# Patient Record
Sex: Female | Born: 1990 | Race: White | Hispanic: No | Marital: Married | Smoking: Never smoker
Health system: Southern US, Community
[De-identification: ages and names within clinical notes are randomized; demographics above are authoritative.]

## PROBLEM LIST (undated history)

## (undated) DIAGNOSIS — K635 Polyp of colon: Secondary | ICD-10-CM

## (undated) HISTORY — DX: Polyp of colon: K63.5

## (undated) HISTORY — PX: FOOT SURGERY: SHX648

---

## 2016-03-04 HISTORY — PX: COLONOSCOPY: SHX5424

## 2018-12-13 ENCOUNTER — Emergency Department (HOSPITAL_COMMUNITY): Payer: BC Managed Care – PPO

## 2018-12-13 ENCOUNTER — Other Ambulatory Visit: Payer: Self-pay

## 2018-12-13 ENCOUNTER — Encounter (HOSPITAL_COMMUNITY): Payer: Self-pay | Admitting: *Deleted

## 2018-12-13 ENCOUNTER — Emergency Department (HOSPITAL_COMMUNITY)
Admission: EM | Admit: 2018-12-13 | Discharge: 2018-12-13 | Disposition: A | Discharge status: Home / Self Care | Payer: BC Managed Care – PPO | Attending: Emergency Medicine | Admitting: Emergency Medicine

## 2018-12-13 DIAGNOSIS — B349 Viral infection, unspecified: Secondary | ICD-10-CM | POA: Diagnosis not present

## 2018-12-13 DIAGNOSIS — R0602 Shortness of breath: Secondary | ICD-10-CM | POA: Diagnosis present

## 2018-12-13 LAB — COMPREHENSIVE METABOLIC PANEL
ALT: 30 U/L (ref 0–44)
AST: 28 U/L (ref 15–41)
Albumin: 4.1 g/dL (ref 3.5–5.0)
Alkaline Phosphatase: 53 U/L (ref 38–126)
Anion gap: 10 (ref 5–15)
BUN: 11 mg/dL (ref 6–20)
CO2: 22 mmol/L (ref 22–32)
Calcium: 9.1 mg/dL (ref 8.9–10.3)
Chloride: 107 mmol/L (ref 98–111)
Creatinine, Ser: 0.73 mg/dL (ref 0.44–1.00)
GFR calc Af Amer: >60 mL/min (ref >60)
GFR calc non Af Amer: >60 mL/min (ref >60)
Glucose, Bld: 114 mg/dL — ABNORMAL HIGH (ref 70–99)
Potassium: 3.1 mmol/L — ABNORMAL LOW (ref 3.5–5.1)
Sodium: 139 mmol/L (ref 135–145)
Total Bilirubin: 0.5 mg/dL (ref 0.3–1.2)
Total Protein: 6.6 g/dL (ref 6.5–8.1)

## 2018-12-13 LAB — URINALYSIS, ROUTINE W REFLEX MICROSCOPIC
Bacteria, UA: NONE SEEN
Bilirubin Urine: NEGATIVE
Glucose, UA: NEGATIVE mg/dL
Ketones, ur: NEGATIVE mg/dL
Leukocytes,Ua: NEGATIVE
Nitrite: NEGATIVE
Protein, ur: 30 mg/dL — AB
Specific Gravity, Urine: 1.031 — ABNORMAL HIGH (ref 1.005–1.030)
pH: 5 (ref 5.0–8.0)

## 2018-12-13 LAB — CBC WITH DIFFERENTIAL/PLATELET
Abs Immature Granulocytes: 0.03 10*3/uL (ref 0.00–0.07)
Basophils Absolute: 0 10*3/uL (ref 0.0–0.1)
Basophils Relative: 0 %
Eosinophils Absolute: 0 10*3/uL (ref 0.0–0.5)
Eosinophils Relative: 0 %
HCT: 39.7 % (ref 36.0–46.0)
Hemoglobin: 13.6 g/dL (ref 12.0–15.0)
Immature Granulocytes: 1 %
Lymphocytes Relative: 21 %
Lymphs Abs: 0.7 10*3/uL (ref 0.7–4.0)
MCH: 29.4 pg (ref 26.0–34.0)
MCHC: 34.3 g/dL (ref 30.0–36.0)
MCV: 85.9 fL (ref 80.0–100.0)
Monocytes Absolute: 0.7 10*3/uL (ref 0.1–1.0)
Monocytes Relative: 20 %
Neutro Abs: 1.9 10*3/uL (ref 1.7–7.7)
Neutrophils Relative %: 58 %
Platelets: 172 10*3/uL (ref 150–400)
RBC: 4.62 MIL/uL (ref 3.87–5.11)
RDW: 12.1 % (ref 11.5–15.5)
WBC: 3.3 10*3/uL — ABNORMAL LOW (ref 4.0–10.5)
nRBC: 0 % (ref 0.0–0.2)

## 2018-12-13 LAB — LACTIC ACID, PLASMA: Lactic Acid, Venous: 0.8 mmol/L (ref 0.5–1.9)

## 2018-12-13 LAB — I-STAT BETA HCG BLOOD, ED (MC, WL, AP ONLY): I-stat hCG, quantitative: <5 m[IU]/mL (ref <5)

## 2018-12-13 IMAGING — DX DG CHEST 1V PORT
1 series · 1 of 1 positions shown · non-contrast
Comparison: None.

CLINICAL DATA: Pt reporting onset of fever, cough, headache, sore
throat and worsening SOB since [REDACTED]. Pt had recent travel to [place].
Pt was tested this morning at an [HOSPITAL], has not yet received
results. SOB appears worse with exertion.

EXAM:
PORTABLE CHEST 1 VIEW

[chest]
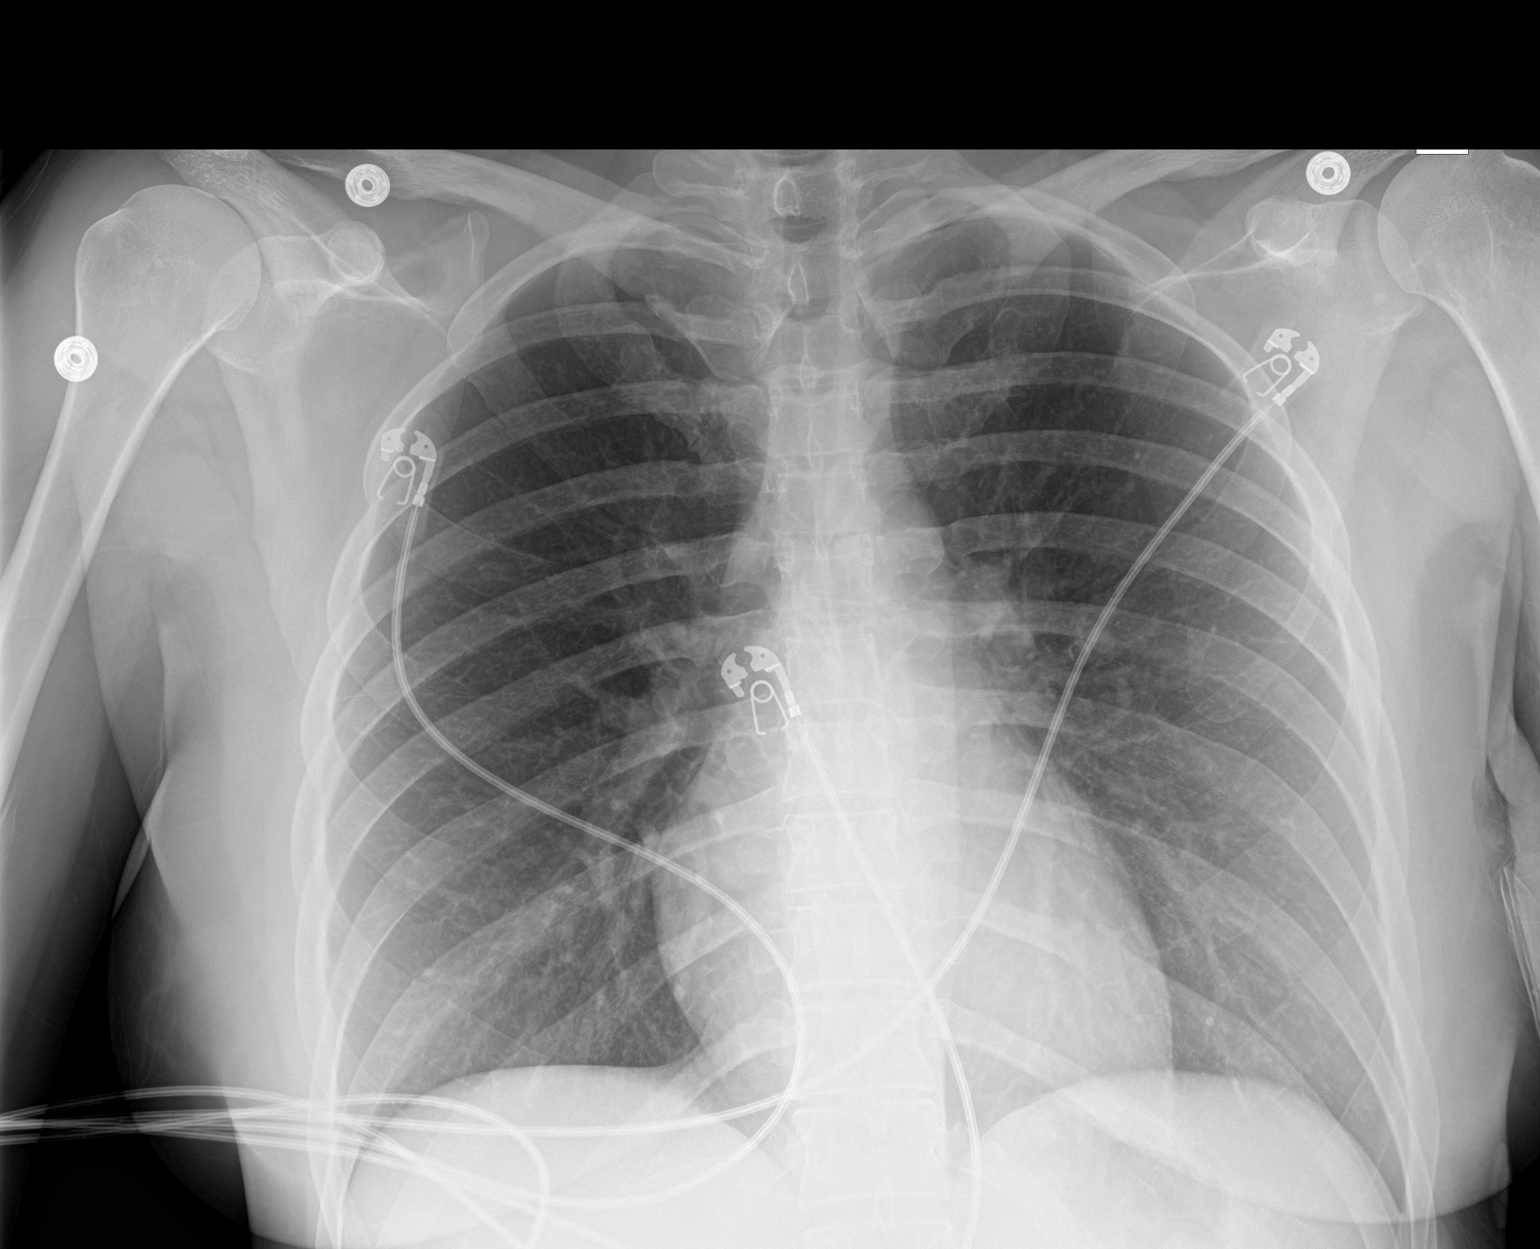

[1 of 1 positions shown; findings below may reference images not displayed]

FINDINGS: The heart size and mediastinal contours are within normal limits.
The lungs are clear. No pneumothorax or large pleural effusion. The
visualized skeletal structures are unremarkable.
IMPRESSION: No active disease.

## 2018-12-13 MED ORDER — ACETAMINOPHEN 325 MG PO TABS
650.0000 mg | ORAL_TABLET | Freq: Once | ORAL | Status: AC
  Administered 2018-12-13: 650 mg via ORAL
  Filled 2018-12-13: qty 2

## 2018-12-13 MED ORDER — SODIUM CHLORIDE 0.9% FLUSH
3.0000 mL | Freq: Once | INTRAVENOUS | Status: AC
  Administered 2018-12-13: 3 mL via INTRAVENOUS

## 2018-12-13 MED ORDER — POTASSIUM CHLORIDE CRYS ER 20 MEQ PO TBCR: 40.0000 meq | EXTENDED_RELEASE_TABLET | Freq: Once | ORAL | Status: DC

## 2018-12-13 MED ORDER — SODIUM CHLORIDE 0.9 % IV BOLUS
1000.0000 mL | Freq: Once | INTRAVENOUS | Status: AC
  Administered 2018-12-13: 1000 mL via INTRAVENOUS

## 2018-12-13 MED ORDER — POTASSIUM CHLORIDE 10 MEQ/100ML IV SOLN
10.0000 meq | Freq: Once | INTRAVENOUS | Status: AC
  Administered 2018-12-13: 10 meq via INTRAVENOUS
  Filled 2018-12-13: qty 100

## 2018-12-13 NOTE — ED Triage Notes (Signed)
Pt reporting onset of fever, cough, headache, sore throat and worsening SOB since Saturday. Pt had recent travel to [facility]. Pt was tested this morning at an Urgent care, has not yet received results. SOB appears worse with exertion.

## 2018-12-13 NOTE — ED Provider Notes (Signed)
[facility] EMERGENCY DEPARTMENT Provider Note   CSN: [id number] Arrival date & time: 12/13/18  1623     History   Chief Complaint Chief Complaint  Patient presents with  . Shortness of Breath    HPI Karen Larsen is a 28 y.o. female who presents to the ED with multiple complaints.  She reports she recently traveled to [place] to visit a friend.  She states she returned home on Tuesday and then on Friday she began feeling bad.  She reports fever with T-max 102.6 yesterday, nonproductive cough, slight headache, sore throat, shortness of breath.  Was seen at urgent care today and was tested negative for the flu.  She also had a COVID swab and was told to self isolate at home until she receives her results.  She reports that the shortness of breath became so bad that she came to the ED for further evaluation.  No chills, drooling, inability to swallow, vision changes, rash, neck stiffness, leg swelling, any other associated symptoms.  Was given an albuterol inhaler at urgent care today which she states she has only used once.       History reviewed. No pertinent past medical history.  There are no active problems to display for this patient.   History reviewed. No pertinent surgical history.   OB History   No obstetric history on file.      Home Medications    Prior to Admission medications   Not on File    Family History No family history on file.  Social History Social History   Tobacco Use  . Smoking status: Not on file  Substance Use Topics  . Alcohol use: Not on file  . Drug use: Not on file     Allergies   Patient has no known allergies.   Review of Systems Review of Systems  Constitutional: Positive for fever. Negative for chills.  HENT: Negative for congestion.   Eyes: Negative for visual disturbance.  Respiratory: Positive for cough and shortness of breath.   Cardiovascular: Negative for palpitations and leg swelling.   Gastrointestinal: Negative for nausea and vomiting.  Genitourinary: Negative for difficulty urinating.  Musculoskeletal: Positive for myalgias. Negative for neck pain and neck stiffness.  Skin: Negative for rash.  Neurological: Positive for headaches.     Physical Exam Updated Vital Signs BP 100/60   Pulse 76   Temp 98.5 F (36.9 C)   Resp 17   Ht 5\' 7"  (1.702 m)   Wt 68 kg   LMP 11/29/2018 Comment: NEG preg test on 10/11  SpO2 99%   BMI 23.49 kg/m   Physical Exam Vitals signs and nursing note reviewed.  Constitutional:      Appearance: She is not ill-appearing or diaphoretic.  HENT:     Head: Normocephalic and atraumatic.     Mouth/Throat:     Mouth: Mucous membranes are moist.     Pharynx: No pharyngeal swelling or oropharyngeal exudate.  Eyes:     Conjunctiva/sclera: Conjunctivae normal.  Neck:     Musculoskeletal: Neck supple.  Cardiovascular:     Rate and Rhythm: Normal rate and regular rhythm.     Pulses: Normal pulses.  Pulmonary:     Effort: Pulmonary effort is normal.     Breath sounds: Normal breath sounds. No decreased breath sounds, wheezing, rhonchi or rales.     Comments: Satting 100% on RA. She is able to speak in full sentences. With ambulation patient continues to saturate above 97%.  Chest:     Chest wall: No tenderness.  Abdominal:     Palpations: Abdomen is soft.     Tenderness: There is no abdominal tenderness. There is no guarding or rebound.  Musculoskeletal:     Right lower leg: No edema.     Left lower leg: No edema.  Skin:    General: Skin is warm and dry.  Neurological:     Mental Status: She is alert.     Comments: CN 3-12 grossly intact A&O x4 GCS 15 Sensation and strength intact Gait nonataxic including with tandem walking Coordination with finger-to-nose WNL Neg romberg, neg pronator drift      ED Treatments / Results  Labs (all labs ordered are listed, but only abnormal results are displayed) Labs Reviewed   COMPREHENSIVE METABOLIC PANEL - Abnormal; Notable for the following components:      Result Value   Potassium 3.1 (*)    Glucose, Bld 114 (*)    All other components within normal limits  CBC WITH DIFFERENTIAL/PLATELET - Abnormal; Notable for the following components:   WBC 3.3 (*)    All other components within normal limits  URINALYSIS, ROUTINE W REFLEX MICROSCOPIC - Abnormal; Notable for the following components:   APPearance HAZY (*)    Specific Gravity, Urine 1.031 (*)    Hgb urine dipstick SMALL (*)    Protein, ur 30 (*)    All other components within normal limits  LACTIC ACID, PLASMA  I-STAT BETA HCG BLOOD, ED ([facility], [facility], [facility] ONLY)    EKG None  Radiology Dg Chest Portable 1 View  Result Date: 12/13/2018 CLINICAL DATA:  Pt reporting onset of fever, cough, headache, sore throat and worsening SOB since Saturday. Pt had recent travel to [facility]. Pt was tested this morning at an Urgent care, has not yet received results. SOB appears worse with exertion. EXAM: PORTABLE CHEST 1 VIEW COMPARISON:  None. FINDINGS: The heart size and mediastinal contours are within normal limits. The lungs are clear. No pneumothorax or large pleural effusion. The visualized skeletal structures are unremarkable. IMPRESSION: No active disease. Electronically Signed   By: Audie Pinto M.D.   On: 12/13/2018 18:40    Procedures Procedures (including critical care time)  Medications Ordered in ED Medications  potassium chloride 10 mEq in 100 mL IVPB (10 mEq Intravenous New Bag/Given 12/13/18 1909)  sodium chloride flush (NS) 0.9 % injection 3 mL (3 mLs Intravenous Given 12/13/18 1911)  acetaminophen (TYLENOL) tablet 650 mg (650 mg Oral Given 12/13/18 1910)  sodium chloride 0.9 % bolus 1,000 mL (1,000 mLs Intravenous New Bag/Given 12/13/18 1907)     Initial Impression / Assessment and Plan / ED Course  I have reviewed the triage vital signs and the nursing notes.  Pertinent labs & imaging results that  were available during my care of the patient were reviewed by me and considered in my medical decision making (see chart for details).    28 year old female presents the ED today with URI-like symptoms that started 2 days ago.  He was seen in urgent care earlier today and tested negative for flu.  Was sent out with COVID swab was told to self isolate.  She reports her shortness of breath became so bad she came to the ED for further evaluation.  Reports fever, headache, shortness of breath, cough, sore throat.  Which are in the ED 98.5.  Patient states she had a fever of 102.6 yesterday but has not taken any fever reducing medications.  She is able to speak in full sentences and satting 99% on room air.  Patient was ambulated while I was in the room with her and continued to saturate above 97% room air.  Lab work was obtained prior to being seen - decreased white blood cell count at 3.3.  Normal lymphocytes.  Has a mildly decreased at 3.1.  Creatinine within normal limits.  No other electrolyte abnormalities. Lactic acid 0.8.  Urinalysis with increased spec gravity - pt was also initially tachycardic on arrival at 108; suspect dehydration. Will give IVFs in the ED and reevaluate. Will replete potassium. Pt complaining of a headache - no focal neuro deficits on exam. No meningeal signs today. Will give Tylenol for headache. Do not feel pt needs further imaging or procedures for headache.  Will obtain chest x-ray at this time to rule out pneumonia.  If negative patient can be discharged home with instructions to await COVID test results.   Chest xray negative.  His heart rate has decreased and is now in the 70s.  He has continued to remain above 95% on room air.  Feel patient is stable for discharge at this time.  Symptomatic treatment discussed with her including need for Tylenol or ibuprofen to reduce fever.  She has been given an albuterol inhaler by urgent care and she is advised to use for symptomatic relief  as well.   Upon reevaluation pt reports she feels improved with with IVF and tylenol. Feel she is stable for discharge home at this time. Strict return precautions have been discussed with patient. She is advised to await covid test and self isolate until she receives her results. If positive she will need to stay home and self isolate for 2 weeks.   This note was prepared using Dragon voice recognition software and may include unintentional dictation errors due to the inherent limitations of voice recognition software.         Final Clinical Impressions(s) / ED Diagnoses   Final diagnoses:  Viral illness    ED Discharge Orders    None       Tanda RockersVenter, Mairlyn Tegtmeyer, Cordelia Poche-C 12/13/18 Eliane Decree2001    Pickering, Nathan, MD 12/13/18 [phone]

## 2018-12-13 NOTE — Discharge Instructions (Addendum)
Your labwork and chest x ray were reassuring today Your potassium was mildly decreased in the ED - we have repleted it here today. Please follow up with your PCP to have this value rechecked. If you do not have one you may follow up with [facility] for your primary care needs.  Please stay home and self isolate until you receive your covid test results. If positive you will need to stay home for 2 weeks. If negative I would still recommend staying home given you work from home and returning to normal daily activity if symptom free for 1 week and fever free for > 72 hours without fever reducing medication     Person Under Monitoring Name: Karen Larsen  Location: [address] [place] [address]   Infection Prevention Recommendations for Individuals Confirmed to have, or Being Evaluated for, 2019 Novel Coronavirus (COVID-19) Infection Who Receive Care at Home  Individuals who are confirmed to have, or are being evaluated for, COVID-19 should follow the prevention steps below until a healthcare provider or local or state health department says they can return to normal activities.  Stay home except to get medical care You should restrict activities outside your home, except for getting medical care. Do not go to work, school, or public areas, and do not use public transportation or taxis.  Call ahead before visiting your doctor Before your medical appointment, call the healthcare provider and tell them that you have, or are being evaluated for, COVID-19 infection. This will help the healthcare providers office take steps to keep other people from getting infected. Ask your healthcare provider to call the local or state health department.  Monitor your symptoms Seek prompt medical attention if your illness is worsening (e.g., difficulty breathing). Before going to your medical appointment, call the healthcare provider and tell them that you have, or are being  evaluated for, COVID-19 infection. Ask your healthcare provider to call the local or state health department.  Wear a facemask You should wear a facemask that covers your nose and mouth when you are in the same room with other people and when you visit a healthcare provider. People who live with or visit you should also wear a facemask while they are in the same room with you.  Separate yourself from other people in your home As much as possible, you should stay in a different room from other people in your home. Also, you should use a separate bathroom, if available.  Avoid sharing household items You should not share dishes, drinking glasses, cups, eating utensils, towels, bedding, or other items with other people in your home. After using these items, you should wash them thoroughly with soap and water.  Cover your coughs and sneezes Cover your mouth and nose with a tissue when you cough or sneeze, or you can cough or sneeze into your sleeve. Throw used tissues in a lined trash can, and immediately wash your hands with soap and water for at least 20 seconds or use an alcohol-based hand rub.  Wash your [employer] your hands often and thoroughly with soap and water for at least 20 seconds. You can use an alcohol-based hand sanitizer if soap and water are not available and if your hands are not visibly dirty. Avoid touching your eyes, nose, and mouth with unwashed hands.   Prevention Steps for Caregivers and Household Members of Individuals Confirmed to have, or Being Evaluated for, COVID-19 Infection Being Cared for in the  Home  If you live with, or provide care at home for, a person confirmed to have, or being evaluated for, COVID-19 infection please follow these guidelines to prevent infection:  Follow healthcare providers instructions Make sure that you understand and can help the patient follow any healthcare provider instructions for all care.  Provide for the patients  basic needs You should help the patient with basic needs in the home and provide support for getting groceries, prescriptions, and other personal needs.  Monitor the patients symptoms If they are getting sicker, call his or her medical provider and tell them that the patient has, or is being evaluated for, COVID-19 infection. This will help the healthcare providers office take steps to keep other people from getting infected. Ask the healthcare provider to call the local or state health department.  Limit the number of people who have contact with the patient If possible, have only one caregiver for the patient. Other household members should stay in another home or place of residence. If this is not possible, they should stay in another room, or be separated from the patient as much as possible. Use a separate bathroom, if available. Restrict visitors who do not have an essential need to be in the home.  Keep older adults, very young children, and other sick people away from the patient Keep older adults, very young children, and those who have compromised immune systems or chronic health conditions away from the patient. This includes people with chronic heart, lung, or kidney conditions, diabetes, and cancer.  Ensure good ventilation Make sure that shared spaces in the home have good air flow, such as from an air conditioner or an opened window, weather permitting.  Wash your hands often Wash your hands often and thoroughly with soap and water for at least 20 seconds. You can use an alcohol based hand sanitizer if soap and water are not available and if your hands are not visibly dirty. Avoid touching your eyes, nose, and mouth with unwashed hands. Use disposable paper towels to dry your hands. If not available, use dedicated cloth towels and replace them when they become wet.  Wear a facemask and gloves Wear a disposable facemask at all times in the room and gloves when you touch or  have contact with the patients blood, body fluids, and/or secretions or excretions, such as sweat, saliva, sputum, nasal mucus, vomit, urine, or feces.  Ensure the mask fits over your nose and mouth tightly, and do not touch it during use. Throw out disposable facemasks and gloves after using them. Do not reuse. Wash your hands immediately after removing your facemask and gloves. If your personal clothing becomes contaminated, carefully remove clothing and launder. Wash your hands after handling contaminated clothing. Place all used disposable facemasks, gloves, and other waste in a lined container before disposing them with other household waste. Remove gloves and wash your hands immediately after handling these items.  Do not share dishes, glasses, or other household items with the patient Avoid sharing household items. You should not share dishes, drinking glasses, cups, eating utensils, towels, bedding, or other items with a patient who is confirmed to have, or being evaluated for, COVID-19 infection. After the person uses these items, you should wash them thoroughly with soap and water.  Wash laundry thoroughly Immediately remove and wash clothes or bedding that have blood, body fluids, and/or secretions or excretions, such as sweat, saliva, sputum, nasal mucus, vomit, urine, or feces, on them. Wear gloves when handling  laundry from the patient. Read and follow directions on labels of laundry or clothing items and detergent. In general, wash and dry with the warmest temperatures recommended on the label.  Clean all areas the individual has used often Clean all touchable surfaces, such as counters, tabletops, doorknobs, bathroom fixtures, toilets, phones, keyboards, tablets, and bedside tables, every day. Also, clean any surfaces that may have blood, body fluids, and/or secretions or excretions on them. Wear gloves when cleaning surfaces the patient has come in contact with. Use a diluted  bleach solution (e.g., dilute bleach with 1 part bleach and 10 parts water) or a household disinfectant with a label that says EPA-registered for coronaviruses. To make a bleach solution at home, add 1 tablespoon of bleach to 1 quart (4 cups) of water. For a larger supply, add  cup of bleach to 1 gallon (16 cups) of water. Read labels of cleaning products and follow recommendations provided on product labels. Labels contain instructions for safe and effective use of the cleaning product including precautions you should take when applying the product, such as wearing gloves or eye protection and making sure you have good ventilation during use of the product. Remove gloves and wash hands immediately after cleaning.  Monitor yourself for signs and symptoms of illness Caregivers and household members are considered close contacts, should monitor their health, and will be asked to limit movement outside of the home to the extent possible. Follow the monitoring steps for close contacts listed on the symptom monitoring form.   ? If you have additional questions, contact your local health department or call the epidemiologist on call at [phone] (available 24/7). ? This guidance is subject to change. For the most up-to-date guidance from [facility], please refer to their website: YouBlogs.pl

## 2019-09-16 ENCOUNTER — Encounter: Payer: Self-pay | Admitting: Neurology

## 2019-11-05 ENCOUNTER — Encounter: Payer: Self-pay | Admitting: Neurology

## 2019-11-05 ENCOUNTER — Other Ambulatory Visit: Payer: Self-pay

## 2019-11-05 ENCOUNTER — Other Ambulatory Visit (INDEPENDENT_AMBULATORY_CARE_PROVIDER_SITE_OTHER): Payer: BC Managed Care – PPO

## 2019-11-05 ENCOUNTER — Ambulatory Visit: Payer: BC Managed Care – PPO | Admitting: Neurology

## 2019-11-05 VITALS — BP 112/72 | HR 67 | Ht 68.0 in | Wt 160.0 lb

## 2019-11-05 DIAGNOSIS — R292 Abnormal reflex: Secondary | ICD-10-CM

## 2019-11-05 DIAGNOSIS — G1220 Motor neuron disease, unspecified: Secondary | ICD-10-CM

## 2019-11-05 LAB — B12 AND FOLATE PANEL
Folate: >24.8 ng/mL (ref >5.9)
Vitamin B-12: 454 pg/mL (ref 211–911)

## 2019-11-05 LAB — FERRITIN: Ferritin: 35.9 ng/mL (ref 10.0–291.0)

## 2019-11-05 LAB — TSH: TSH: 1.77 u[IU]/mL (ref 0.35–4.50)

## 2019-11-05 NOTE — Patient Instructions (Addendum)
MRI lumbar spine without contrast  Check labs   

## 2019-11-05 NOTE — Progress Notes (Signed)
[facility] Neurology Division Clinic Note - Initial Visit   Date: 11/05/19  Karen Larsen MRN: [id number] DOB: Oct 12, 1990   Dear Dr. Madelon Lips:  Thank you for your kind referral of Karen Larsen for consultation of bilateral leg pain. Although her history is well known to you, please allow [place] to reiterate it for the purpose of our medical record. The patient was accompanied to the clinic by self.    History of Present Illness: Karen Larsen is a 29 y.o. right-handed female presenting for evaluation of bilateral leg pain and discomfort. Starting around 2018, she began having discomfort in the legs described as twitching legs, throbbing ache, pins and needle. It involves her lower legs, when severe can extend into her thigh.  Symptoms occur almost every night.  It is worse with strenuous activity in the evening. . Flights longer than 6 hours tends to aggravate her symptoms. She has some relief with compression socks and a weighted blanket.   She works as a Medical illustrator.  She lives with husband.  Her mother and maternal grandfather both of CMT.  She denies numbness/tinglng of the feet.    Past Medical History:  Diagnosis Date  . Colon polyps     Past Surgical History:  Procedure Laterality Date  . COLONOSCOPY  2018     Medications:  Outpatient Encounter Medications as of 11/05/2019  Medication Sig  . aspirin EC 81 MG tablet Take 81 mg by mouth daily. Swallow whole.  . diphenhydrAMINE HCl (ZZZQUIL) 50 MG/30ML LIQD Take by mouth as needed.  . meclizine (ANTIVERT) 25 MG tablet Take 25 mg by mouth 2 (two) times daily as needed for dizziness.  . Prenat-Methylfol-Chol-Fish Oil (PRENATAL + COMPLETE MULTI PO) Take 1 tablet by mouth daily.   No facility-administered encounter medications on file as of 11/05/2019.    Allergies: No Known Allergies  Family History: Family History  Problem Relation Age of Onset  . Charcot-Marie-Tooth disease Mother   . Stroke Father      Social History: Social History   Tobacco Use  . Smoking status: Never Smoker  . Smokeless tobacco: Never Used  Vaping Use  . Vaping Use: Never used  Substance Use Topics  . Alcohol use: Yes  . Drug use: Not on file   Social History   Social History Narrative  . Not on file    Vital Signs:  BP 112/72   Pulse 67   Ht 5\' 8"  (1.727 m)   Wt 160 lb (72.6 kg)   LMP 11/29/2018 Comment: NEG preg test on 10/11  SpO2 100%   BMI 24.33 kg/m   Neurological Exam: MENTAL STATUS including orientation to time, place, person, recent and remote memory, attention span and concentration, language, and fund of knowledge is normal.  Speech is not dysarthric.  CRANIAL NERVES: II:  No visual field defects.  Unremarkable fundi.   III-IV-VI: Pupils equal round and reactive to light.  Normal conjugate, extra-ocular eye movements in all directions of gaze.  No nystagmus.  No ptosis.   V:  Normal facial sensation.    VII:  Normal facial symmetry and movements.   VIII:  Normal hearing and vestibular function.   IX-X:  Normal palatal movement.   XI:  Normal shoulder shrug and head rotation.   XII:  Normal tongue strength and range of motion, no deviation or fasciculation.  MOTOR:  Motor strength is 5/5 throughout. She has give-way weakness in the feet bilaterally and nonphysiologic movement of the toes.  No pronator drift.   MSRs:  Right        Left                  brachioradialis 2+  2+  biceps 2+  2+  triceps 2+  2+  patellar 3+  3+  ankle jerk 3+  3+  Hoffman no  no  plantar response down  down   SENSORY:  Normal and symmetric perception of light touch, pinprick, vibration, and proprioception.  Romberg's sign absent.   COORDINATION/GAIT: Normal finger-to- nose-finger and heel-to-shin.  Intact rapid alternating movements bilaterally.  Able to rise from a chair without using arms.  Gait narrow based and stable. Tandem and stressed gait intact.    IMPRESSION: Bilateral leg  dysesthesias, exam with hyperreflexia in the legs.  Need to evaluate for spinal cord pathology.  RLS remains diagnosis of exclusion   - Check MRI lumbar spine wo contast.    - Check vitamin B12, ferritin, vitamin B1, TSH, folate  - She may need additional imaging based on above findings  Further recommendations pending results.   Thank you for allowing me to participate in patient's care.  If I can answer any additional questions, I would be pleased to do so.    Sincerely,    Cole Klugh K. Allena Katz, DO

## 2019-11-09 LAB — VITAMIN B1: Vitamin B1 (Thiamine): 19 nmol/L (ref 8–30)

## 2019-11-10 ENCOUNTER — Telehealth: Payer: Self-pay

## 2019-11-10 NOTE — Telephone Encounter (Signed)
Called patient and informed her of Dr. Eliane Decree recommendation. Patient verbalized understanding.Patient will call her OBGYN.

## 2019-11-10 NOTE — Telephone Encounter (Signed)
Typically MRI in pregnancy is safe, but I would recommend that she talk to her OBGYN to see if there is any concern since she would be in her first trimester.

## 2019-11-10 NOTE — Telephone Encounter (Signed)
Spoke to patient and informed her that labs were normal. Patient wanted to ask Dr. Allena Katz if it's ok to get a MRI while pregnant? She informed me that she and her husband are actively trying for a baby and could possibly be [redacted] weeks pregnant when she has her MRI. Patient stated that she doen't want to harm their baby if she becomes pregnant. Informed patient I would send Dr Allena Katz a message to advise.

## 2019-11-10 NOTE — Telephone Encounter (Signed)
-----   Message from [name], DO sent at 11/09/2019  3:59 PM EDT ----- Please notify patient lab are within normal limits.  Thank you.

## 2019-11-27 ENCOUNTER — Ambulatory Visit
Admission: RE | Admit: 2019-11-27 | Discharge: 2019-11-27 | Disposition: A | Discharge status: Home / Self Care | Payer: BC Managed Care – PPO | Source: Ambulatory Visit | Attending: Neurology | Admitting: Neurology

## 2019-11-27 DIAGNOSIS — R292 Abnormal reflex: Secondary | ICD-10-CM

## 2019-11-27 DIAGNOSIS — G1220 Motor neuron disease, unspecified: Secondary | ICD-10-CM

## 2019-11-27 IMAGING — MR MR LUMBAR SPINE W/O CM
4 of 5 series · 27 of 48 positions shown · non-contrast
Comparison: None.

CLINICAL DATA: Initial evaluation for low back pain with tingling
and restless lower extremities.

EXAM:
MRI LUMBAR SPINE WITHOUT CONTRAST
TECHNIQUE: Multiplanar, multisequence MR imaging of the lumbar spine was
performed. No intravenous contrast was administered.

[Series 2: T2 · sagittal · 4.0mm · 1.09mm/px · 5 of 16 slices shown (1 of 2)]
[im 1/16]
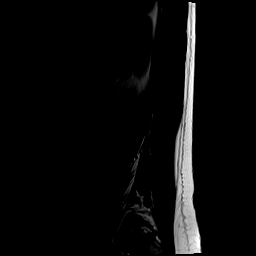
[im 4/16]
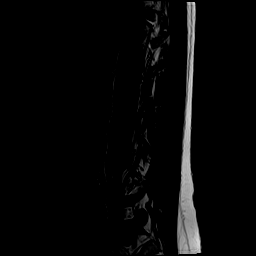
[im 8/16]
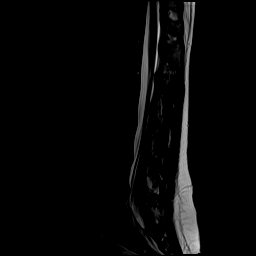
[im 12/16]
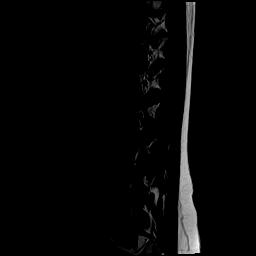
[im 16/16]
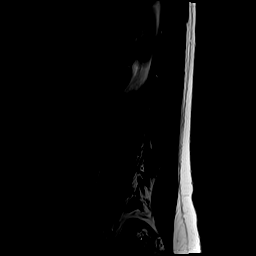

[Series 4: T1 · sagittal · 4.0mm · 1.09mm/px · 6 of 16 slices shown (1 of 2)]
[im 1/16]
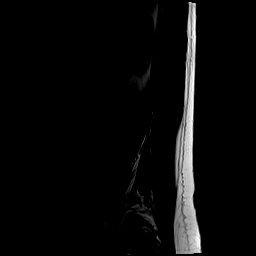
[im 4/16]
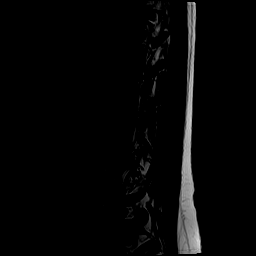
[im 7/16]
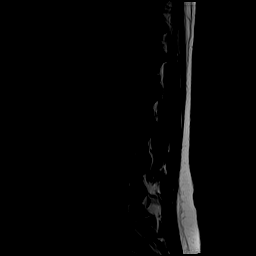
[im 10/16]
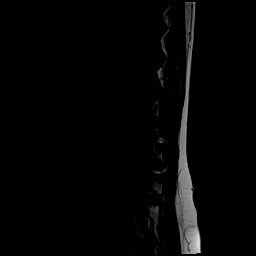
[im 13/16]
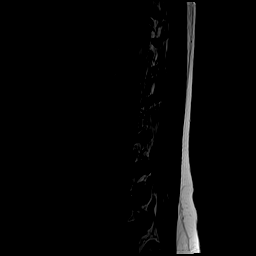
[im 16/16]
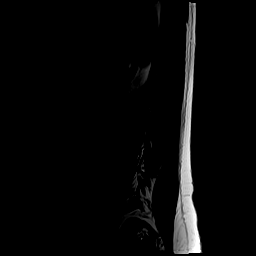

[Series 5: T2 · axial · 4.0mm · 0.39mm/px · z∈[-107,+99]mm · 10 of 44 slices shown (2 of 2)]
[im 3/44]
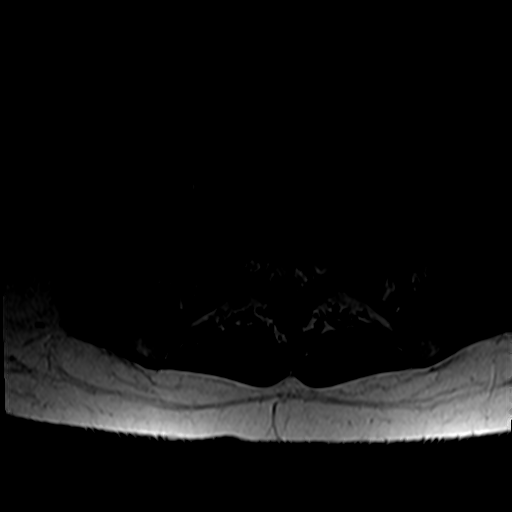
[im 6/44]
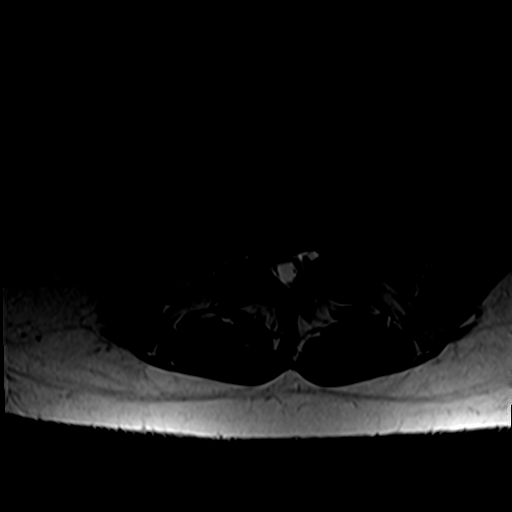
[im 9/44]
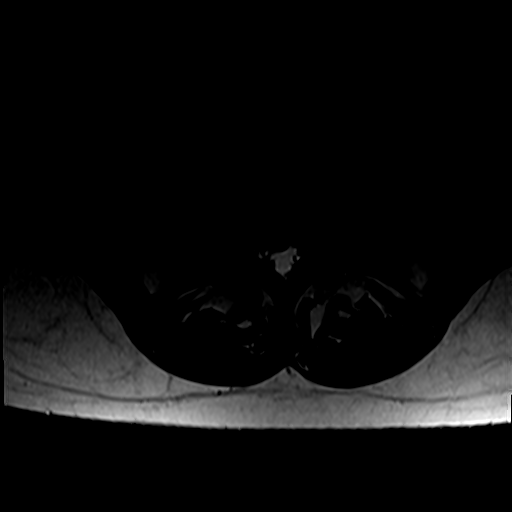
[im 15/44]
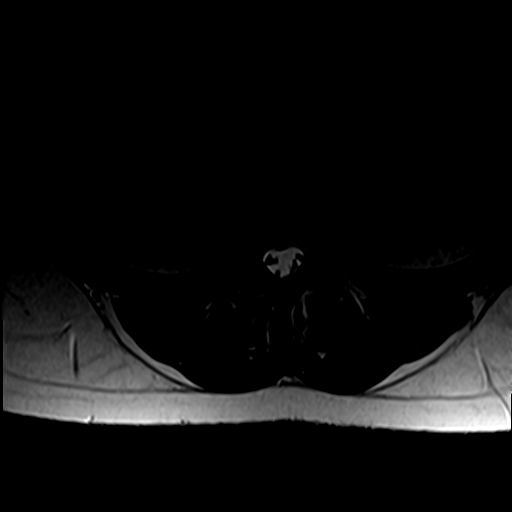
[im 21/44]
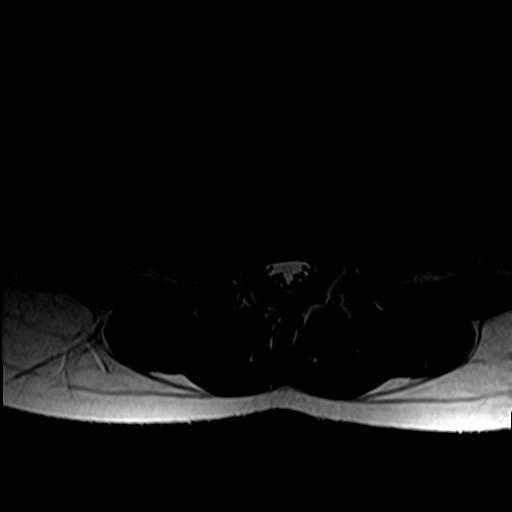
[im 23/44]
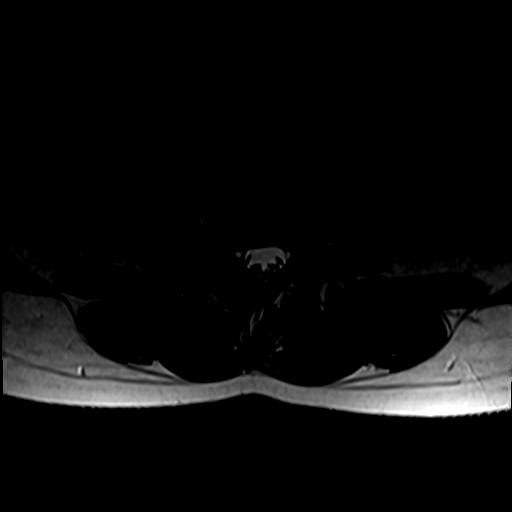
[im 26/44]
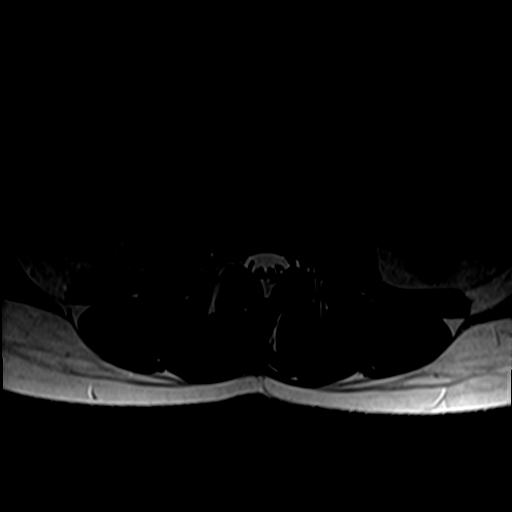
[im 32/44]
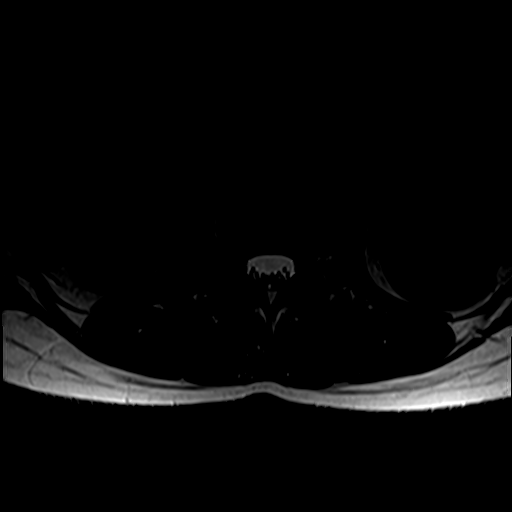
[im 38/44]
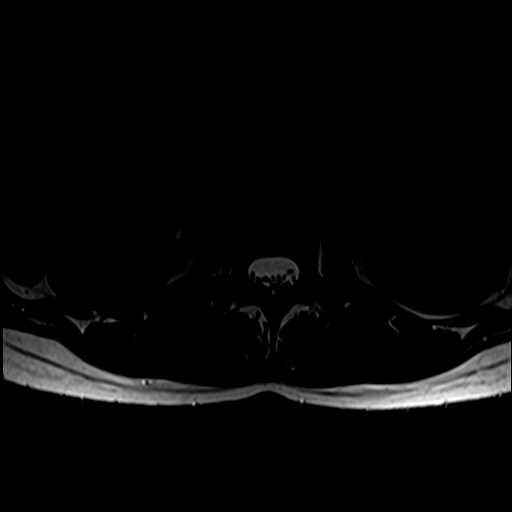
[im 44/44]
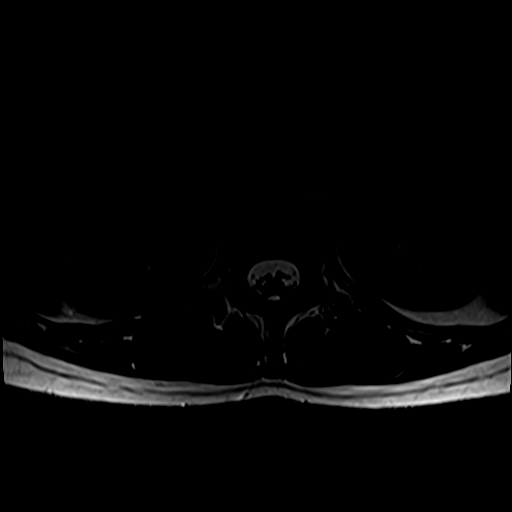

[Series 6: T1 · axial · 4.0mm · 0.39mm/px · z∈[-107,+70]mm · 6 of 44 slices shown (2 of 2)]
[im 3/44]
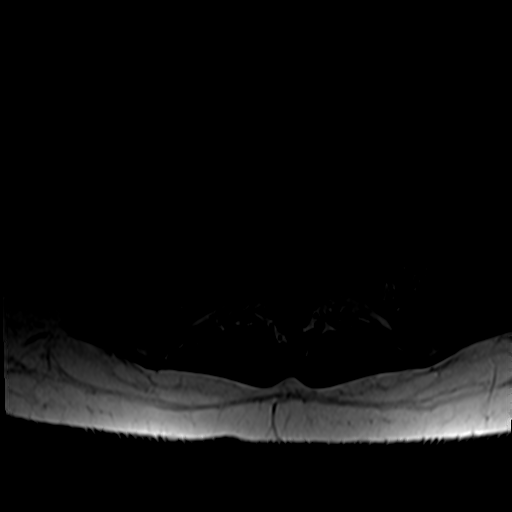
[im 6/44]
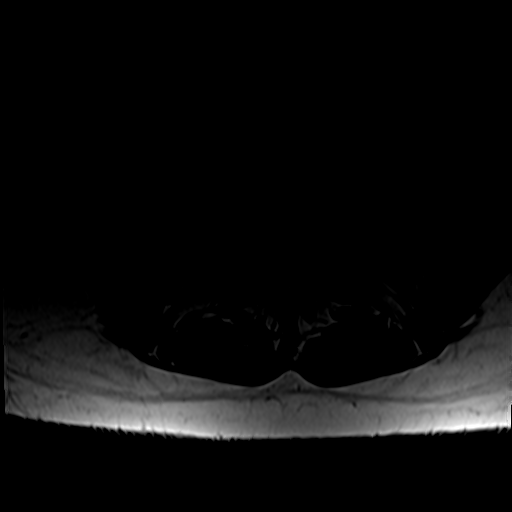
[im 9/44]
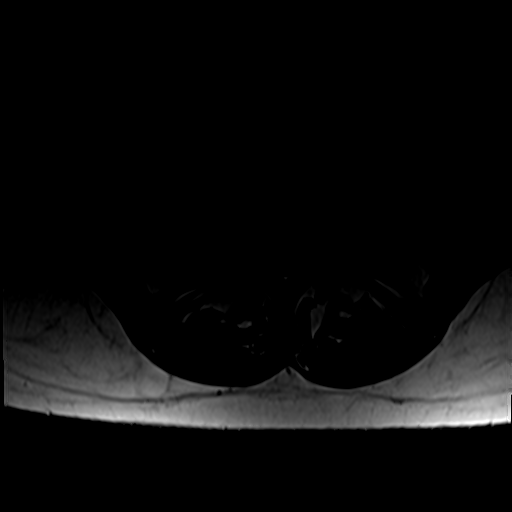
[im 15/44]
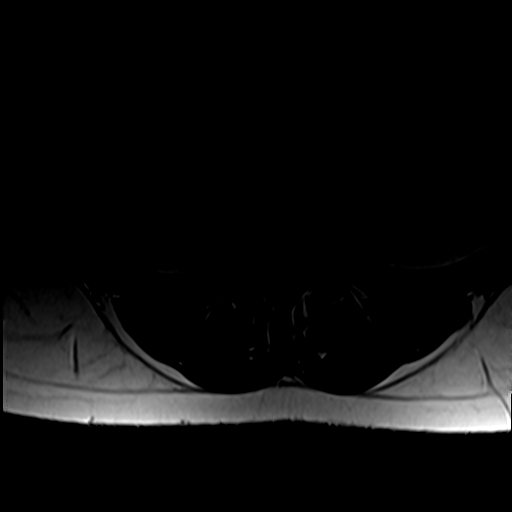
[im 23/44]
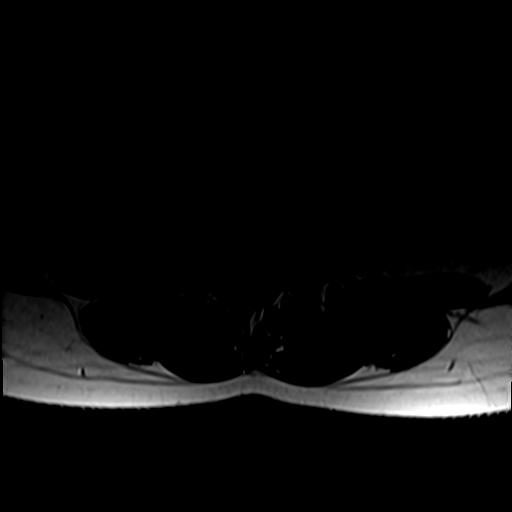
[im 38/44]
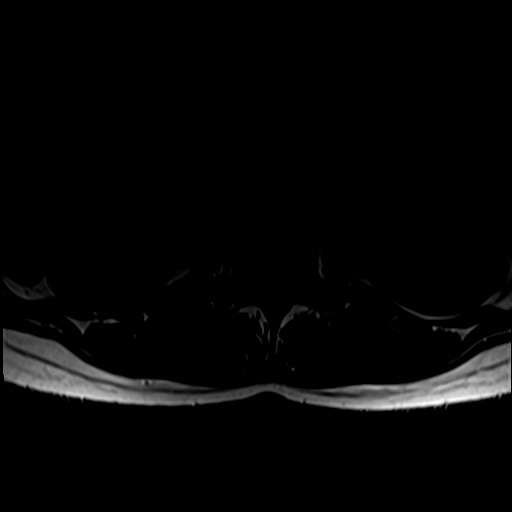

[27 of 48 positions shown; findings below may reference images not displayed]

FINDINGS: Segmentation:  Standard.

Alignment: Straightening of the normal lumbar lordosis. No
listhesis.

Vertebrae: Vertebral body height maintained without acute or chronic
fracture. Bone marrow signal intensity normal. No discrete or
worrisome osseous lesions. No abnormal marrow edema.

Conus medullaris and cauda equina: Conus extends to the L1 level.
Conus and cauda equina appear normal.

Paraspinal and other soft tissues: Negative.

Disc levels:

Lumbar spine is image from the T11-12 level inferiorly. No
significant findings are seen through the L3-4 level.

L4-5: Diffuse disc bulge with disc desiccation. Superimposed small
central disc protrusion with slight inferior migration. Mild
bilateral facet hypertrophy. Resultant mild canal with mild
bilateral subarticular stenosis, left slightly worse than right.
Foramina remain patent.

L5-S1: Unremarkable.
IMPRESSION: 1. Disc bulging with small central disc protrusion at L4-5 with
resultant mild canal and bilateral subarticular stenosis.
2. Otherwise normal MRI of the lumbar spine.

## 2019-11-29 ENCOUNTER — Telehealth: Payer: Self-pay

## 2019-11-29 DIAGNOSIS — R292 Abnormal reflex: Secondary | ICD-10-CM

## 2019-11-29 NOTE — Telephone Encounter (Signed)
-----   Message from [name], DO sent at 11/29/2019 12:44 PM EDT ----- Please inform patient that MRI lumbar spine looks good - there is a small disc bulge, but nothing which would explain her leg symptoms.  Therefore, the next step is to look higher in the spinal cord with MRI cervical and thoracic spine wo contrast - pls order, if pt agreeable. Thanks.

## 2019-11-29 NOTE — Telephone Encounter (Signed)
Called patient and informed her of results and recommendations of MRI thoracic and cervical spine w/o contrast. Patient agreed to have orders sent for new imaging. Orders sent to [facility]. Patient is aware.

## 2019-12-19 ENCOUNTER — Other Ambulatory Visit: Payer: BC Managed Care – PPO

## 2019-12-20 ENCOUNTER — Ambulatory Visit: Payer: BC Managed Care – PPO | Admitting: Neurology

## 2019-12-30 ENCOUNTER — Ambulatory Visit
Admission: RE | Admit: 2019-12-30 | Discharge: 2019-12-30 | Disposition: A | Discharge status: Home / Self Care | Payer: BC Managed Care – PPO | Source: Ambulatory Visit | Attending: Neurology | Admitting: Neurology

## 2019-12-30 ENCOUNTER — Other Ambulatory Visit: Payer: Self-pay

## 2019-12-30 DIAGNOSIS — R292 Abnormal reflex: Secondary | ICD-10-CM

## 2019-12-30 IMAGING — MR MR CERVICAL SPINE W/O CM
5 series · 37 of 48 positions shown · non-contrast
Comparison: None.

CLINICAL DATA: Numbness and tingling. Paresthesia. Bilateral leg
pain.

EXAM:
MRI CERVICAL SPINE WITHOUT CONTRAST
TECHNIQUE: Multiplanar, multisequence MR imaging of the cervical spine was
performed. No intravenous contrast was administered.

[Series 3: T2 · sagittal · 3.0mm · 0.41mm/px · 7 of 17 slices shown (1 of 2)]
[im 1/17]
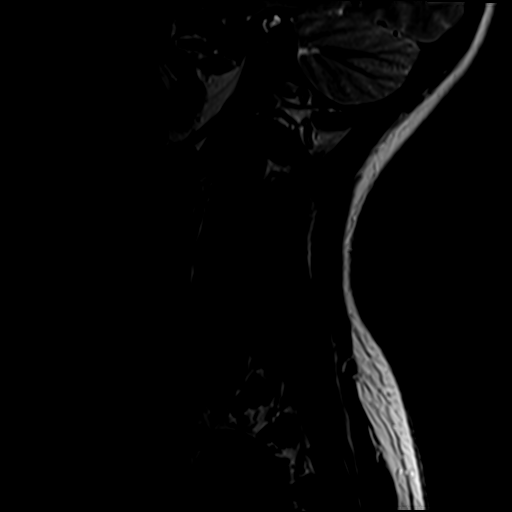
[im 3/17]
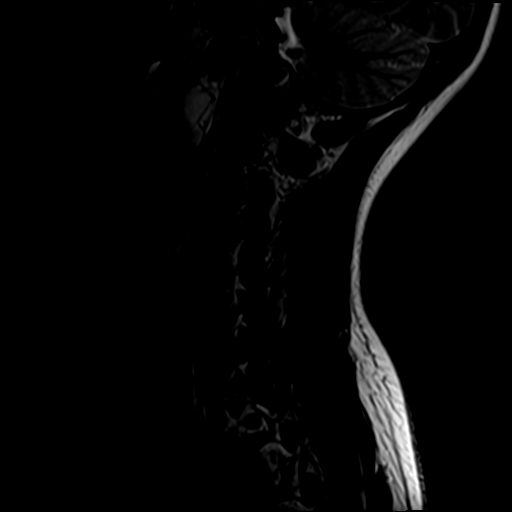
[im 6/17]
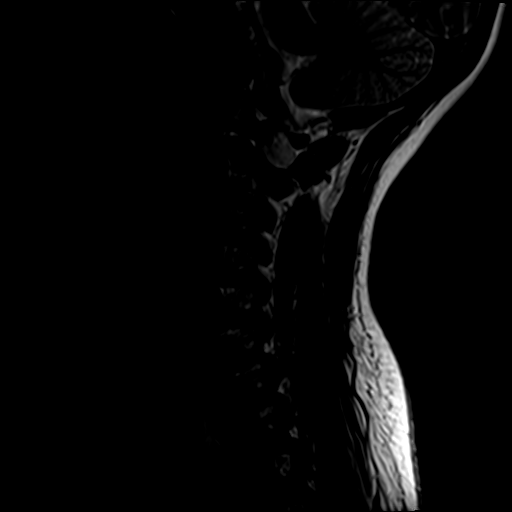
[im 9/17]
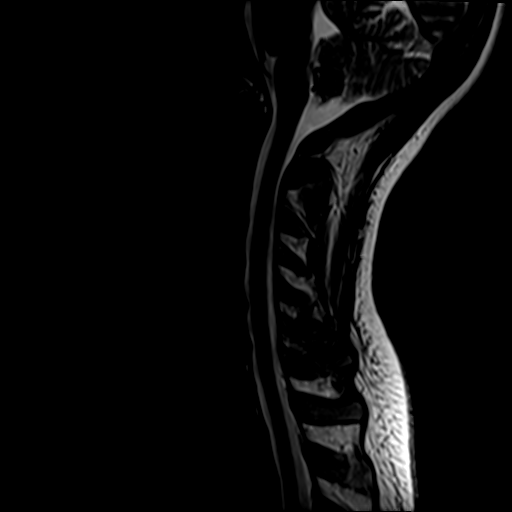
[im 11/17]
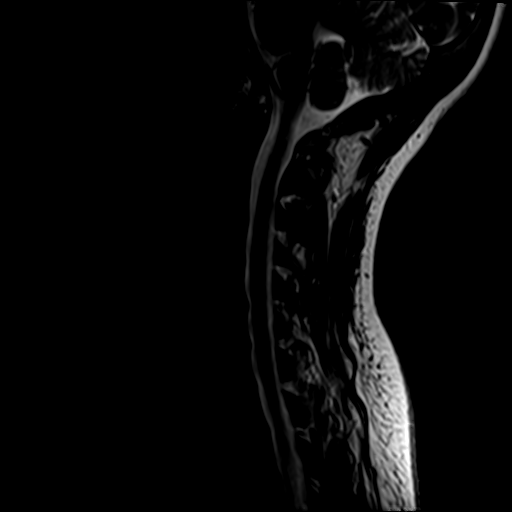
[im 14/17]
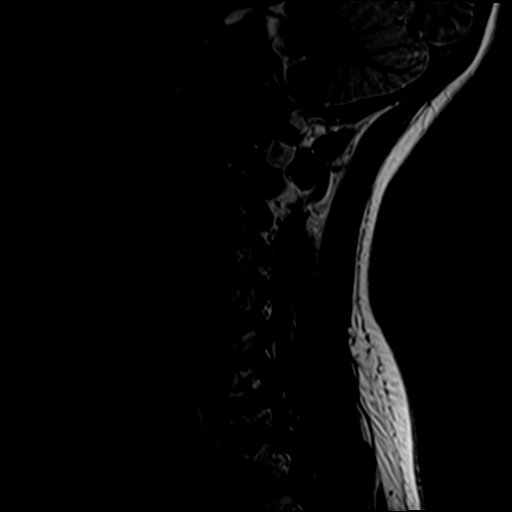
[im 17/17]
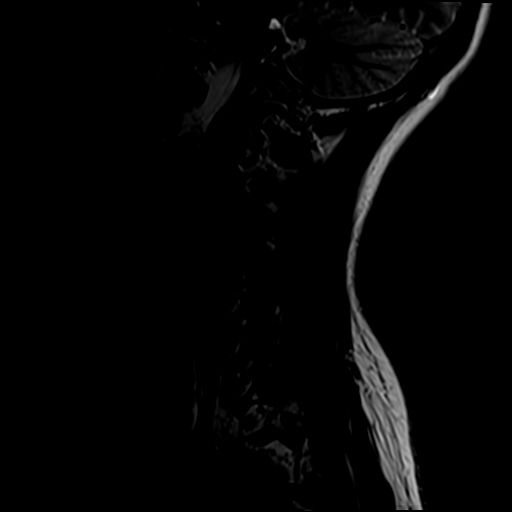

[Series 4: STIR · sagittal · 3.0mm · 0.82mm/px · 8 of 17 slices shown]
[im 1/17]
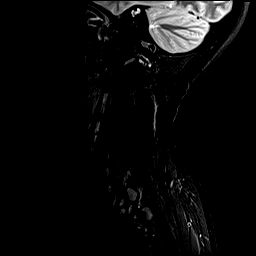
[im 3/17]
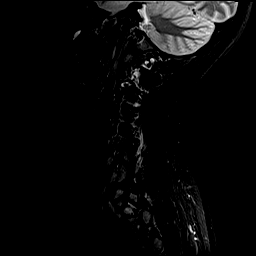
[im 5/17]
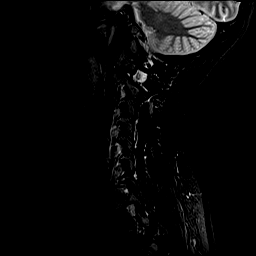
[im 7/17]
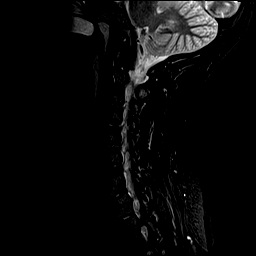
[im 10/17]
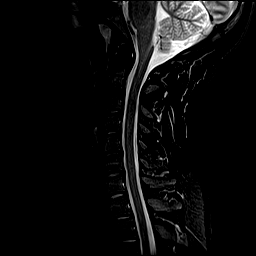
[im 12/17]
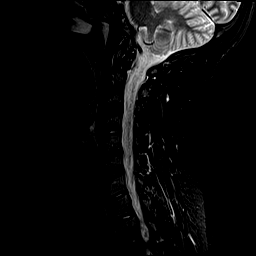
[im 14/17]
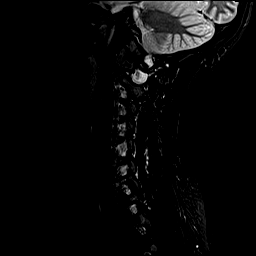
[im 17/17]
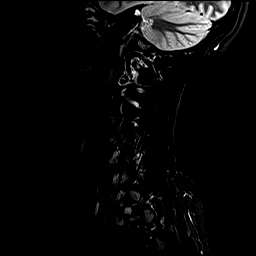

[Series 5: T1 · sagittal · 3.0mm · 0.82mm/px · 8 of 17 slices shown]
[im 1/17]
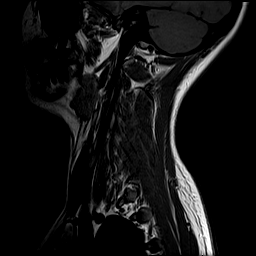
[im 3/17]
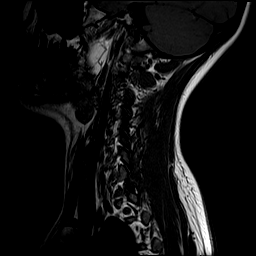
[im 5/17]
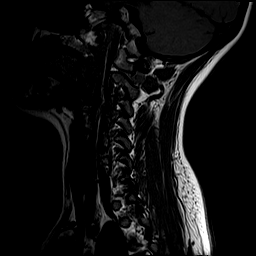
[im 7/17]
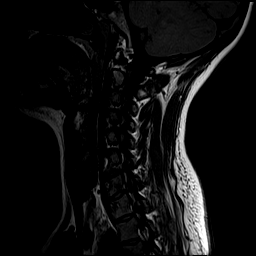
[im 10/17]
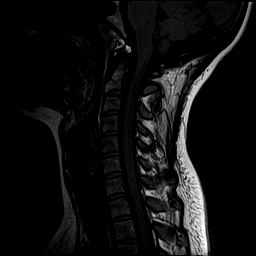
[im 12/17]
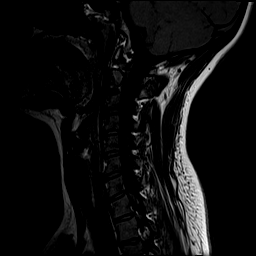
[im 14/17]
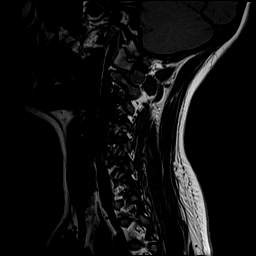
[im 17/17]
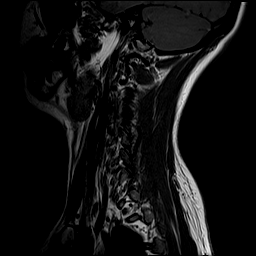

[Series 6: T2 · axial · 3.0mm · 0.70mm/px · z∈[-86,+9]mm · 9 of 27 slices shown (2 of 2)]
[im 1/27]
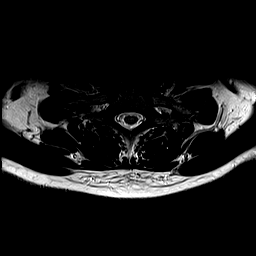
[im 5/27]
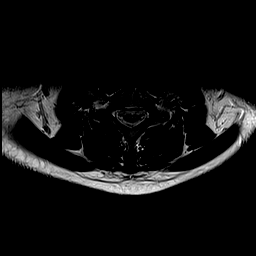
[im 8/27]
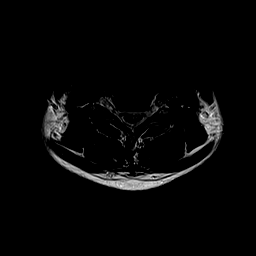
[im 12/27]
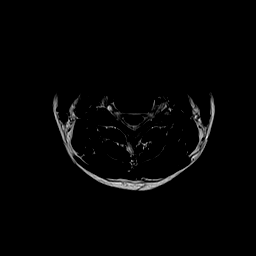
[im 15/27]
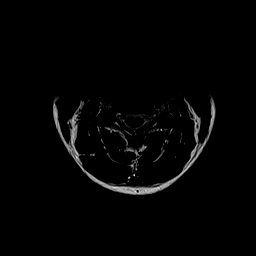
[im 19/27]
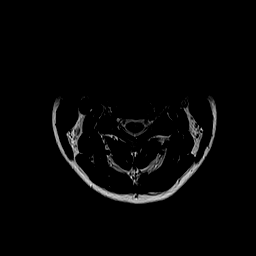
[im 22/27]
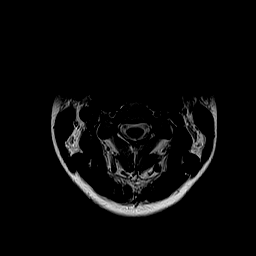
[im 24/27]
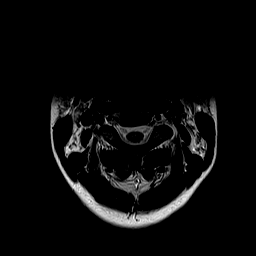
[im 27/27]
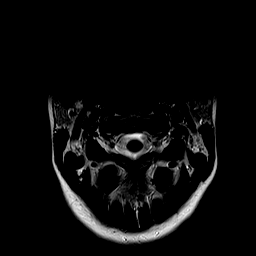

[Series 7: GRE · axial · 3.0mm · 0.35mm/px · z∈[-87,-33]mm · 5 of 28 slices shown]
[im 1/28]
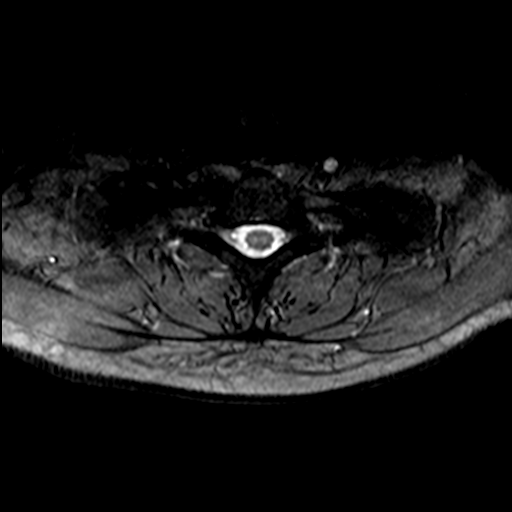
[im 5/28]
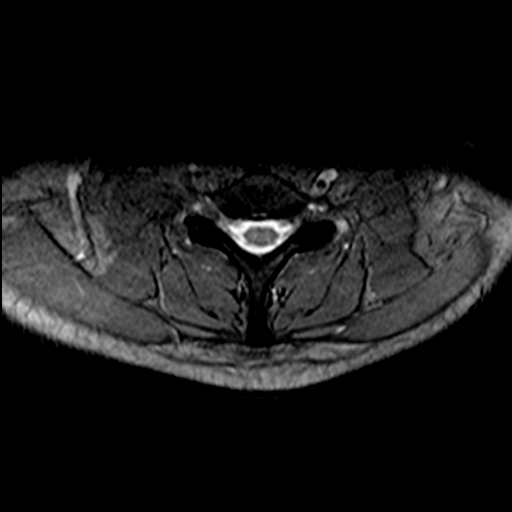
[im 10/28]
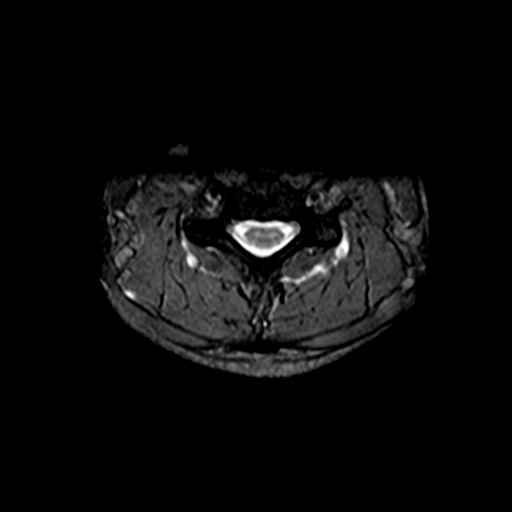
[im 12/28]
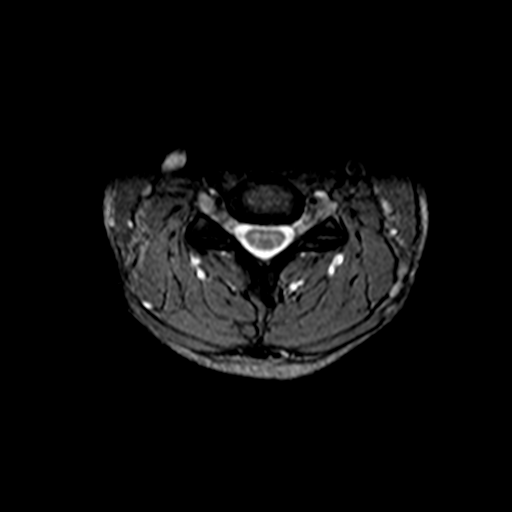
[im 16/28]
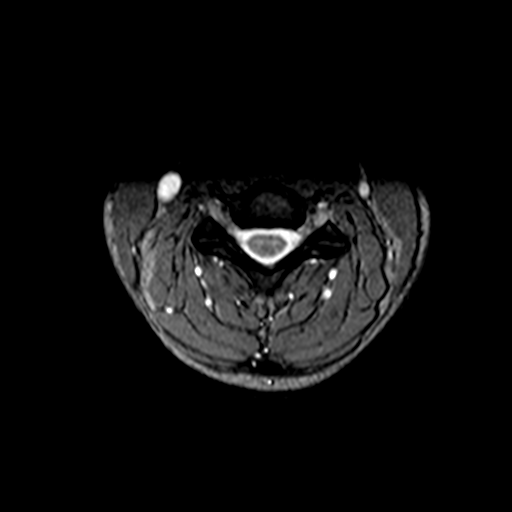

[37 of 48 positions shown; findings below may reference images not displayed]

FINDINGS: Alignment: Normal

Vertebrae: Normal bone marrow.  Negative for fracture or mass.

Cord: Normal spinal cord signal. No cord lesion or cord compression.

Posterior Fossa, vertebral arteries, paraspinal tissues: Negative

Disc levels:

Mild disc bulging at C5-6 and C6-7 without stenosis. No disc
protrusion or neural impingement. Remaining disc spaces normal.
IMPRESSION: Mild disc bulging C5-6 and C6-7. No spinal stenosis or neural
impingement. Normal appearing spinal cord.

## 2019-12-30 IMAGING — MR MR THORACIC SPINE W/O CM
4 of 6 series · 18 of 48 positions shown · non-contrast
Comparison: None.

CLINICAL DATA: Mid to low back pain. Paresthesias. Bilateral leg
pain.

EXAM:
MRI THORACIC SPINE WITHOUT CONTRAST
TECHNIQUE: Multiplanar, multisequence MR imaging of the thoracic spine was
performed. No intravenous contrast was administered.

[Series 4: T2 · sagittal · 4.0mm · 0.47mm/px · 6 of 17 slices shown (1 of 3)]
[im 1/17]
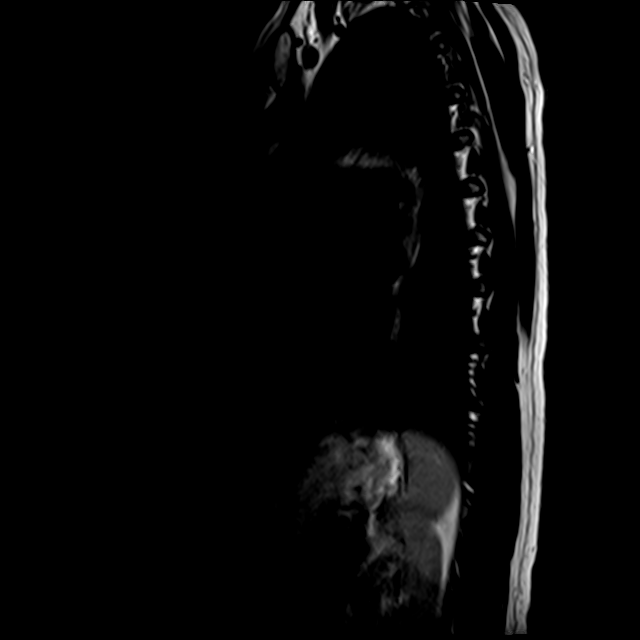
[im 4/17]
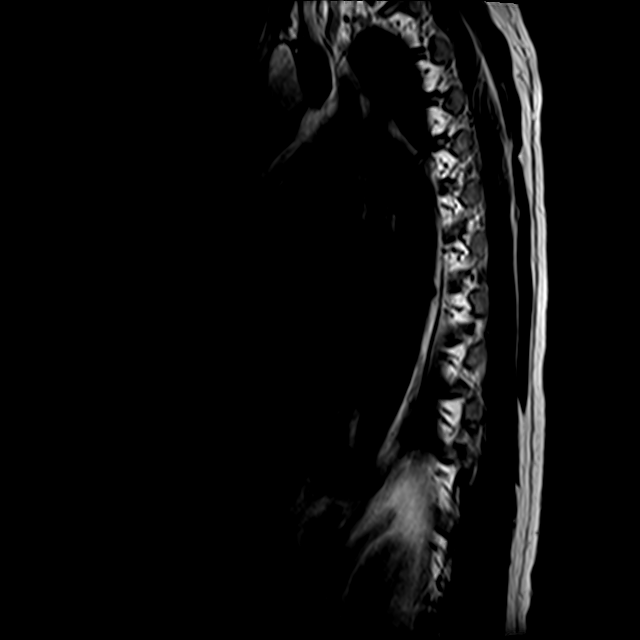
[im 7/17]
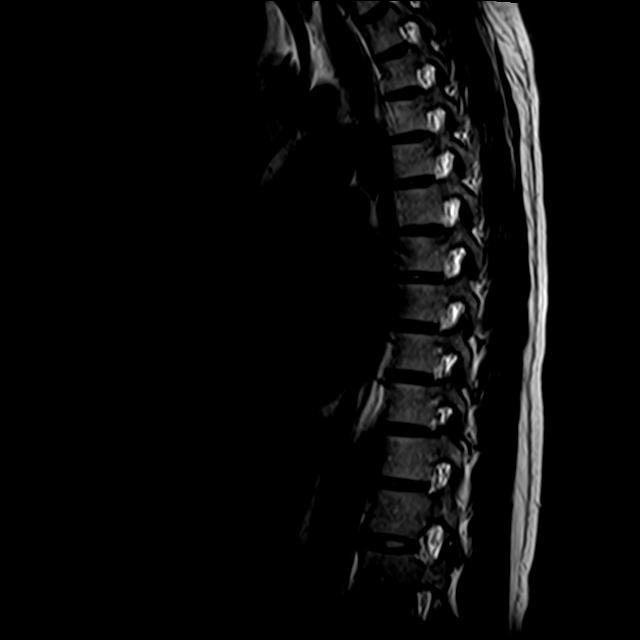
[im 10/17]
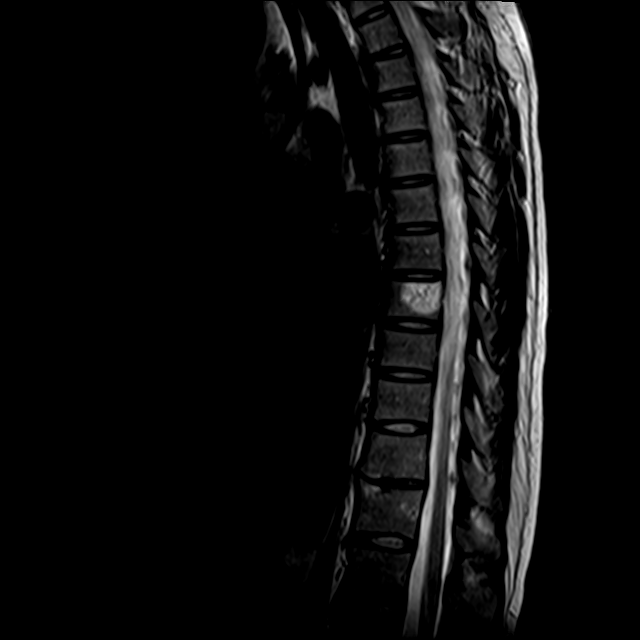
[im 13/17]
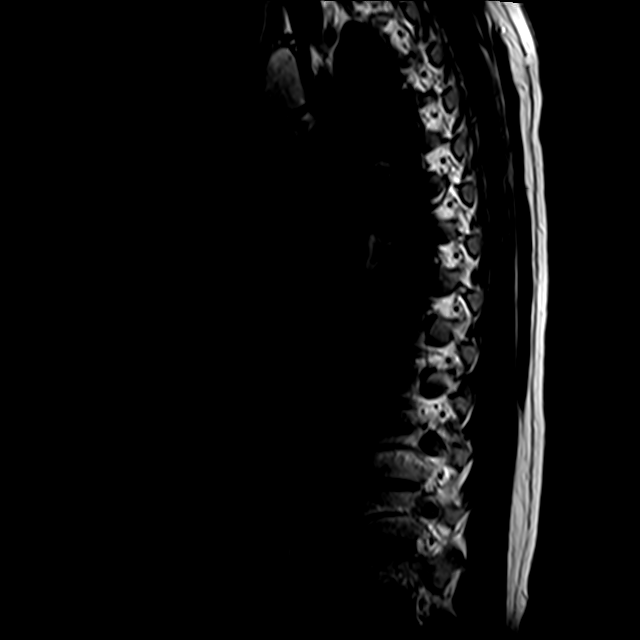
[im 17/17]
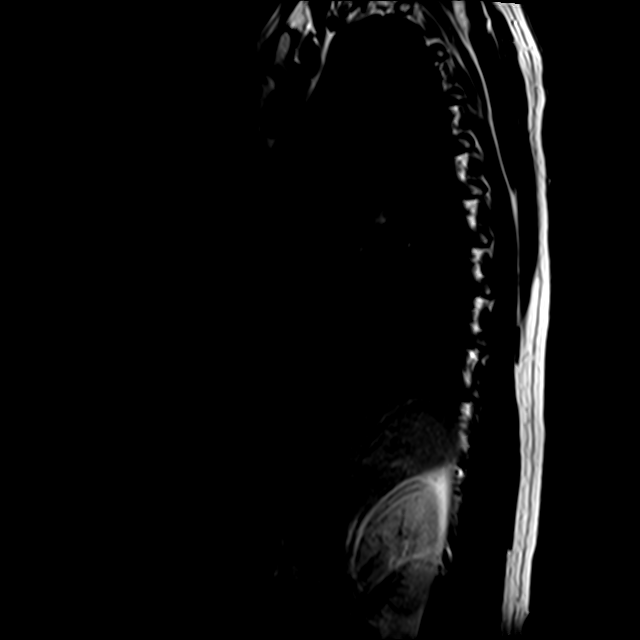

[Series 6: T1 · sagittal · 4.0mm · 0.94mm/px · 3 of 17 slices shown]
[im 4/17]
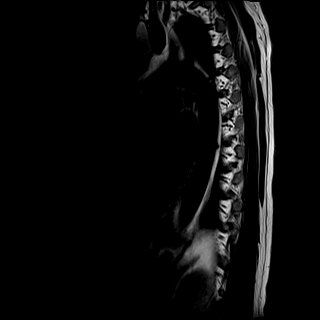
[im 10/17]
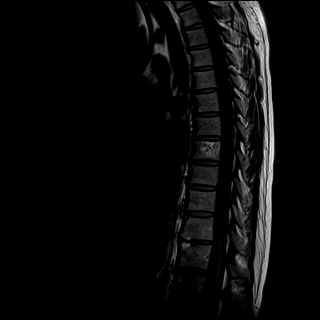
[im 17/17]
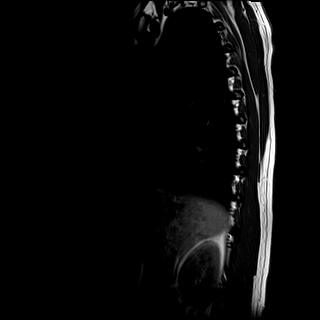

[Series 7: T2 · axial · 4.0mm · 0.39mm/px · z∈[-330,-124]mm · 6 of 36 slices shown (2 of 3)]
[im 1/36]
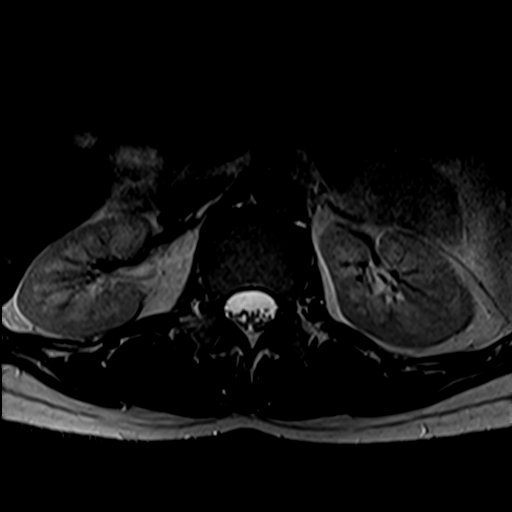
[im 7/36]
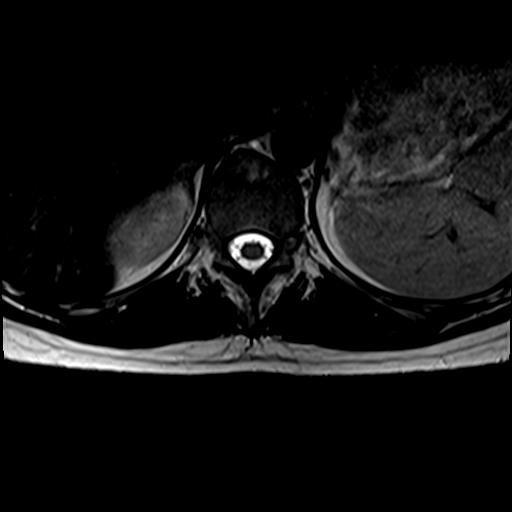
[im 10/36]
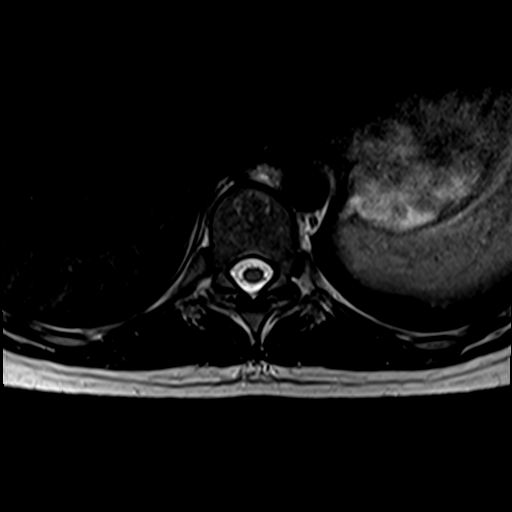
[im 16/36]
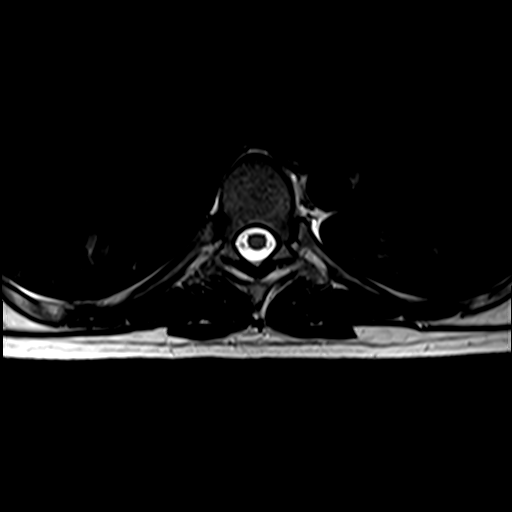
[im 20/36]
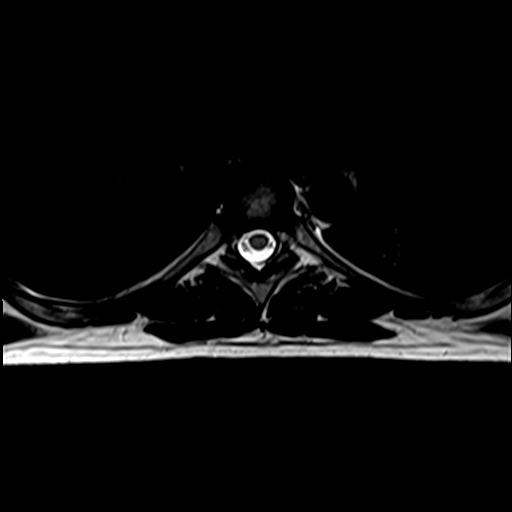
[im 32/36]
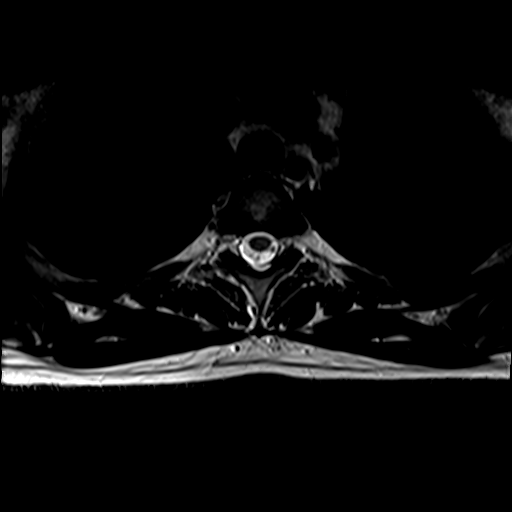

[Series 8: T2 · axial · 4.0mm · 0.39mm/px · z∈[-280,-124]mm · 3 of 36 slices shown (3 of 3)]
[im 7/36]
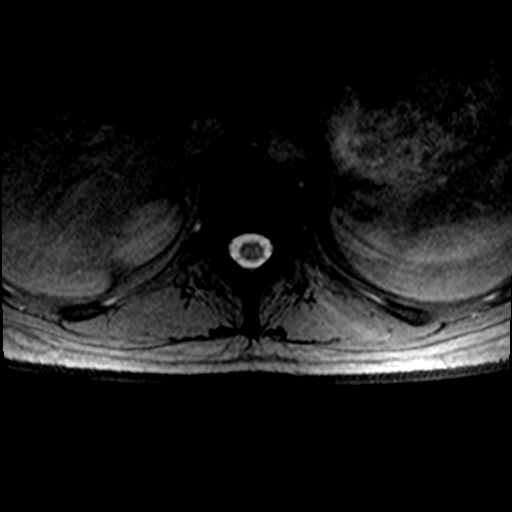
[im 20/36]
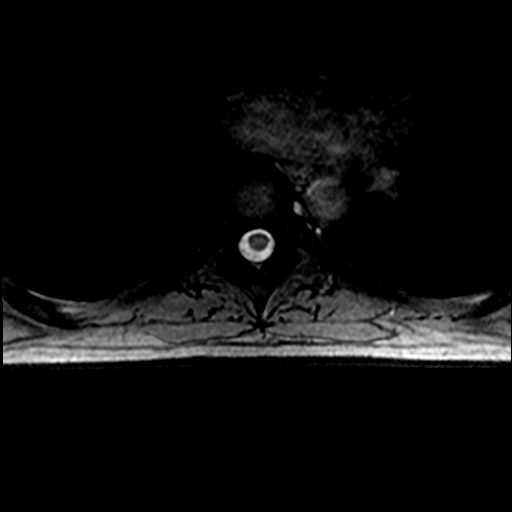
[im 32/36]
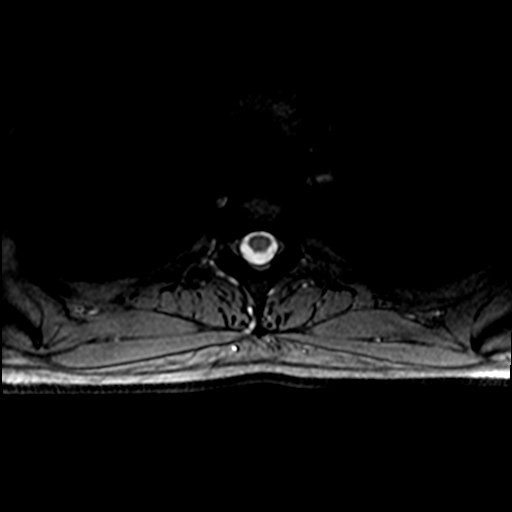

[18 of 48 positions shown; findings below may reference images not displayed]

FINDINGS: Alignment:  Normal

Vertebrae: Negative for fracture or mass. Prominent hemangioma T8
vertebral body on the right without surrounding edema or
extraosseous component.

Cord: Normal signal and morphology. No cord compression or signal
abnormality.

Paraspinal and other soft tissues: Negative

Disc levels:

Normal disc spaces.  No disc protrusion or stenosis.
IMPRESSION: Essentially normal MRI thoracic spine. Incidental hemangioma T8
vertebral body on the right.

## 2019-12-31 ENCOUNTER — Telehealth: Payer: Self-pay

## 2019-12-31 DIAGNOSIS — G1220 Motor neuron disease, unspecified: Secondary | ICD-10-CM

## 2019-12-31 NOTE — Telephone Encounter (Signed)
-----   Message from [name], DO sent at 12/31/2019  1:56 PM EDT ----- Please inform patient that her MRI of the cervical spine and thoracic spine looks good - nothing which would explain her leg symptoms.  The only other imaging we can do is check MRI brain wo contrast, if she is agreeable.

## 2019-12-31 NOTE — Telephone Encounter (Signed)
Called patient and left message for a call back.  

## 2019-12-31 NOTE — Telephone Encounter (Signed)
Called patient and informed her of results. Patient is agreeable to have MRI of brain w/o contrast. Informed patient that a PA will be done and order sent. Patient verbalized understanding.

## 2019-12-31 NOTE — Telephone Encounter (Signed)
-----   Message from Donika K Patel, DO sent at 12/31/2019  1:56 PM EDT ----- °Please inform patient that her MRI of the cervical spine and thoracic spine looks good - nothing which would explain her leg symptoms.  The only other imaging we can do is check MRI brain wo contrast, if she is agreeable.   °

## 2020-01-01 ENCOUNTER — Other Ambulatory Visit: Payer: BC Managed Care – PPO

## 2020-01-14 ENCOUNTER — Ambulatory Visit: Payer: BC Managed Care – PPO | Admitting: Neurology

## 2020-02-08 ENCOUNTER — Other Ambulatory Visit: Payer: BC Managed Care – PPO

## 2020-02-10 ENCOUNTER — Other Ambulatory Visit: Payer: BC Managed Care – PPO

## 2020-02-10 DIAGNOSIS — Z20822 Contact with and (suspected) exposure to covid-19: Secondary | ICD-10-CM

## 2020-02-11 LAB — NOVEL CORONAVIRUS, NAA: SARS-CoV-2, NAA: NOT DETECTED

## 2020-02-11 LAB — SARS-COV-2, NAA 2 DAY TAT

## 2020-03-04 NOTE — L&D Delivery Note (Signed)
Delivery Note At 11:10 PM a viable female was delivered via Vaginal, Spontaneous (Presentation:    DOA  ).  APGAR:  per nursing notes, needed deep DeLee suctioning, ; weight pending .   Placenta status: Spontaneous, Intact.  Cord:  3VC with the following complications: none .  Cord pH: sent due to recurrent decels  Anesthesia:  epidural Episiotomy:  none Lacerations:  1st Suture Repair: 3.0 vicryl rapide 2 single figure of 8's Est. Blood Loss (mL):  350  Mom to postpartum.  Baby to Couplet care / Skin to Skin.  Lendon Colonel 09/21/2020, 12:03 AM

## 2020-06-28 LAB — OB RESULTS CONSOLE RPR: RPR: NONREACTIVE

## 2020-08-24 LAB — OB RESULTS CONSOLE GBS: GBS: NEGATIVE

## 2020-08-24 LAB — OB RESULTS CONSOLE RUBELLA ANTIBODY, IGM: Rubella: IMMUNE

## 2020-08-24 LAB — OB RESULTS CONSOLE HIV ANTIBODY (ROUTINE TESTING): HIV: NONREACTIVE

## 2020-08-24 LAB — OB RESULTS CONSOLE HEPATITIS B SURFACE ANTIGEN: Hepatitis B Surface Ag: NEGATIVE

## 2020-09-14 ENCOUNTER — Encounter (HOSPITAL_COMMUNITY): Payer: Self-pay | Admitting: *Deleted

## 2020-09-14 ENCOUNTER — Telehealth (HOSPITAL_COMMUNITY): Payer: Self-pay | Admitting: *Deleted

## 2020-09-14 NOTE — Telephone Encounter (Signed)
Preadmission screen  

## 2020-09-15 ENCOUNTER — Encounter (HOSPITAL_COMMUNITY): Payer: Self-pay | Admitting: *Deleted

## 2020-09-15 ENCOUNTER — Telehealth (HOSPITAL_COMMUNITY): Payer: Self-pay | Admitting: *Deleted

## 2020-09-15 NOTE — Telephone Encounter (Signed)
Preadmission screen  

## 2020-09-18 ENCOUNTER — Other Ambulatory Visit (HOSPITAL_COMMUNITY)
Admission: RE | Admit: 2020-09-18 | Discharge: 2020-09-18 | Disposition: A | Discharge status: Home / Self Care | Payer: 59 | Source: Ambulatory Visit | Attending: Obstetrics | Admitting: Obstetrics

## 2020-09-18 DIAGNOSIS — Z01812 Encounter for preprocedural laboratory examination: Secondary | ICD-10-CM | POA: Insufficient documentation

## 2020-09-18 DIAGNOSIS — Z20822 Contact with and (suspected) exposure to covid-19: Secondary | ICD-10-CM | POA: Insufficient documentation

## 2020-09-18 LAB — SARS CORONAVIRUS 2 (TAT 6-24 HRS): SARS Coronavirus 2: NEGATIVE

## 2020-09-19 ENCOUNTER — Other Ambulatory Visit: Payer: Self-pay | Admitting: Obstetrics

## 2020-09-20 ENCOUNTER — Encounter (HOSPITAL_COMMUNITY): Payer: Self-pay | Admitting: Obstetrics

## 2020-09-20 ENCOUNTER — Other Ambulatory Visit: Payer: Self-pay

## 2020-09-20 ENCOUNTER — Inpatient Hospital Stay (HOSPITAL_COMMUNITY): Payer: 59 | Admitting: Anesthesiology

## 2020-09-20 ENCOUNTER — Inpatient Hospital Stay (HOSPITAL_COMMUNITY)
Admission: AD | Admit: 2020-09-20 | Discharge: 2020-09-22 | DRG: 806 | Disposition: A | Discharge status: Home / Self Care | Payer: 59 | Attending: Obstetrics | Admitting: Obstetrics

## 2020-09-20 ENCOUNTER — Inpatient Hospital Stay (HOSPITAL_COMMUNITY): Payer: 59

## 2020-09-20 DIAGNOSIS — O26893 Other specified pregnancy related conditions, third trimester: Secondary | ICD-10-CM | POA: Diagnosis present

## 2020-09-20 DIAGNOSIS — Z20822 Contact with and (suspected) exposure to covid-19: Secondary | ICD-10-CM | POA: Diagnosis present

## 2020-09-20 DIAGNOSIS — O9962 Diseases of the digestive system complicating childbirth: Secondary | ICD-10-CM | POA: Diagnosis present

## 2020-09-20 DIAGNOSIS — O480 Post-term pregnancy: Secondary | ICD-10-CM | POA: Diagnosis present

## 2020-09-20 DIAGNOSIS — O99214 Obesity complicating childbirth: Secondary | ICD-10-CM | POA: Diagnosis present

## 2020-09-20 DIAGNOSIS — Z3A40 40 weeks gestation of pregnancy: Secondary | ICD-10-CM

## 2020-09-20 DIAGNOSIS — K219 Gastro-esophageal reflux disease without esophagitis: Secondary | ICD-10-CM | POA: Diagnosis present

## 2020-09-20 DIAGNOSIS — Z6791 Unspecified blood type, Rh negative: Secondary | ICD-10-CM

## 2020-09-20 DIAGNOSIS — Z3490 Encounter for supervision of normal pregnancy, unspecified, unspecified trimester: Secondary | ICD-10-CM | POA: Diagnosis present

## 2020-09-20 DIAGNOSIS — O26899 Other specified pregnancy related conditions, unspecified trimester: Secondary | ICD-10-CM

## 2020-09-20 DIAGNOSIS — O2243 Hemorrhoids in pregnancy, third trimester: Secondary | ICD-10-CM | POA: Diagnosis present

## 2020-09-20 LAB — CBC
HCT: 40.4 % (ref 36.0–46.0)
Hemoglobin: 13.6 g/dL (ref 12.0–15.0)
MCH: 29.6 pg (ref 26.0–34.0)
MCHC: 33.7 g/dL (ref 30.0–36.0)
MCV: 88 fL (ref 80.0–100.0)
Platelets: 187 10*3/uL (ref 150–400)
RBC: 4.59 MIL/uL (ref 3.87–5.11)
RDW: 13.1 % (ref 11.5–15.5)
WBC: 10.5 10*3/uL (ref 4.0–10.5)
nRBC: 0 % (ref 0.0–0.2)

## 2020-09-20 LAB — TYPE AND SCREEN
ABO/RH(D): B NEG
Antibody Screen: POSITIVE

## 2020-09-20 MED ORDER — ACETAMINOPHEN 325 MG PO TABS
650.0000 mg | ORAL_TABLET | ORAL | Status: DC | PRN
  Administered 2020-09-20: 650 mg via ORAL
  Filled 2020-09-20: qty 2

## 2020-09-20 MED ORDER — PHENYLEPHRINE 40 MCG/ML (10ML) SYRINGE FOR IV PUSH (FOR BLOOD PRESSURE SUPPORT)
80.0000 ug | PREFILLED_SYRINGE | INTRAVENOUS | Status: AC | PRN
Start: 2020-09-20 — End: 2020-09-20
  Administered 2020-09-20 (×3): 80 ug via INTRAVENOUS
  Filled 2020-09-20: qty 10

## 2020-09-20 MED ORDER — OXYTOCIN-SODIUM CHLORIDE 30-0.9 UT/500ML-% IV SOLN
1.0000 m[IU]/min | INTRAVENOUS | Status: DC
  Administered 2020-09-20: 2 m[IU]/min via INTRAVENOUS
  Filled 2020-09-20: qty 500

## 2020-09-20 MED ORDER — ONDANSETRON HCL 4 MG/2ML IJ SOLN
4.0000 mg | Freq: Four times a day (QID) | INTRAMUSCULAR | Status: DC | PRN
  Administered 2020-09-20: 4 mg via INTRAVENOUS
  Filled 2020-09-20: qty 2

## 2020-09-20 MED ORDER — SOD CITRATE-CITRIC ACID 500-334 MG/5ML PO SOLN: 30.0000 mL | ORAL | Status: DC | PRN

## 2020-09-20 MED ORDER — OXYTOCIN-SODIUM CHLORIDE 30-0.9 UT/500ML-% IV SOLN: 2.5000 [IU]/h | INTRAVENOUS | Status: DC

## 2020-09-20 MED ORDER — LIDOCAINE HCL (PF) 1 % IJ SOLN
INTRAMUSCULAR | Status: DC | PRN
  Administered 2020-09-20: 3 mL via EPIDURAL
  Administered 2020-09-20: 5 mL via EPIDURAL
  Administered 2020-09-20: 2 mL via EPIDURAL

## 2020-09-20 MED ORDER — EPHEDRINE 5 MG/ML INJ
10.0000 mg | INTRAVENOUS | Status: DC | PRN
  Filled 2020-09-20: qty 5

## 2020-09-20 MED ORDER — OXYTOCIN BOLUS FROM INFUSION: 333.0000 mL | Freq: Once | INTRAVENOUS | Status: DC

## 2020-09-20 MED ORDER — FENTANYL CITRATE (PF) 100 MCG/2ML IJ SOLN
INTRAMUSCULAR | Status: AC
  Filled 2020-09-20: qty 2

## 2020-09-20 MED ORDER — LACTATED RINGERS IV SOLN
500.0000 mL | Freq: Once | INTRAVENOUS | Status: AC
  Administered 2020-09-20: 500 mL via INTRAVENOUS

## 2020-09-20 MED ORDER — LACTATED RINGERS IV SOLN: 500.0000 mL | INTRAVENOUS | Status: DC | PRN

## 2020-09-20 MED ORDER — PHENYLEPHRINE 40 MCG/ML (10ML) SYRINGE FOR IV PUSH (FOR BLOOD PRESSURE SUPPORT)
80.0000 ug | PREFILLED_SYRINGE | INTRAVENOUS | Status: DC | PRN
  Administered 2020-09-20: 80 ug via INTRAVENOUS
  Filled 2020-09-20: qty 10

## 2020-09-20 MED ORDER — LACTATED RINGERS IV SOLN: 500.0000 mL | Freq: Once | INTRAVENOUS | Status: DC

## 2020-09-20 MED ORDER — DIPHENHYDRAMINE HCL 50 MG/ML IJ SOLN
12.5000 mg | INTRAMUSCULAR | Status: DC | PRN
Start: 2020-09-20 — End: 2020-09-21

## 2020-09-20 MED ORDER — SODIUM CHLORIDE 0.9 % IV SOLN
25.0000 mg | Freq: Four times a day (QID) | INTRAVENOUS | Status: DC | PRN
  Filled 2020-09-20 (×3): qty 1

## 2020-09-20 MED ORDER — FENTANYL CITRATE (PF) 100 MCG/2ML IJ SOLN
100.0000 ug | INTRAMUSCULAR | Status: DC | PRN
  Administered 2020-09-20: 100 ug via INTRAVENOUS

## 2020-09-20 MED ORDER — LACTATED RINGERS IV SOLN: INTRAVENOUS | Status: DC

## 2020-09-20 MED ORDER — TERBUTALINE SULFATE 1 MG/ML IJ SOLN: 0.2500 mg | Freq: Once | INTRAMUSCULAR | Status: DC | PRN

## 2020-09-20 MED ORDER — FENTANYL-BUPIVACAINE-NACL 0.5-0.125-0.9 MG/250ML-% EP SOLN
12.0000 mL/h | EPIDURAL | Status: DC | PRN
  Administered 2020-09-20: 14 mL/h via EPIDURAL
  Filled 2020-09-20: qty 250

## 2020-09-20 MED ORDER — LIDOCAINE HCL (PF) 1 % IJ SOLN: 30.0000 mL | INTRAMUSCULAR | Status: DC | PRN

## 2020-09-20 MED ORDER — EPHEDRINE 5 MG/ML INJ
10.0000 mg | INTRAVENOUS | Status: DC | PRN
  Administered 2020-09-20: 10 mg via INTRAVENOUS

## 2020-09-20 NOTE — Anesthesia Preprocedure Evaluation (Signed)
Anesthesia Evaluation  Patient identified by MRN, date of birth, ID band Patient awake    Reviewed: Allergy & Precautions, NPO status , Patient's Chart, lab work & pertinent test results  Airway Mallampati: II  TM Distance: >3 FB Neck ROM: Full    Dental  (+) Teeth Intact, Dental Advisory Given   Pulmonary neg pulmonary ROS,    Pulmonary exam normal breath sounds clear to auscultation       Cardiovascular negative cardio ROS Normal cardiovascular exam Rhythm:Regular Rate:Normal     Neuro/Psych negative neurological ROS     GI/Hepatic negative GI ROS, Neg liver ROS,   Endo/Other  Obesity   Renal/GU negative Renal ROS     Musculoskeletal negative musculoskeletal ROS (+)   Abdominal   Peds  Hematology negative hematology ROS (+) Plt 187k   Anesthesia Other Findings Day of surgery medications reviewed with the patient.  Reproductive/Obstetrics (+) Pregnancy                             Anesthesia Physical Anesthesia Plan  ASA: 2  Anesthesia Plan: Epidural   Post-op Pain Management:    Induction:   PONV Risk Score and Plan: 2 and Treatment may vary due to age or medical condition  Airway Management Planned: Natural Airway  Additional Equipment:   Intra-op Plan:   Post-operative Plan:   Informed Consent: I have reviewed the patients History and Physical, chart, labs and discussed the procedure including the risks, benefits and alternatives for the proposed anesthesia with the patient or authorized representative who has indicated his/her understanding and acceptance.     Dental advisory given  Plan Discussed with:   Anesthesia Plan Comments: (Patient identified. Risks/Benefits/Options discussed with patient including but not limited to bleeding, infection, nerve damage, paralysis, failed block, incomplete pain control, headache, blood pressure changes, nausea, vomiting,  reactions to medication both or allergic, itching and postpartum back pain. Confirmed with bedside nurse the patient's most recent platelet count. Confirmed with patient that they are not currently taking any anticoagulation, have any bleeding history or any family history of bleeding disorders. Patient expressed understanding and wished to proceed. All questions were answered. )        Anesthesia Quick Evaluation

## 2020-09-20 NOTE — Anesthesia Procedure Notes (Signed)
Epidural Patient location during procedure: OB Start time: 09/20/2020 3:11 PM End time: 09/20/2020 3:18 PM  Staffing Anesthesiologist: Cecile Hearing, MD Performed: anesthesiologist   Preanesthetic Checklist Completed: patient identified, IV checked, risks and benefits discussed, monitors and equipment checked, pre-op evaluation and timeout performed  Epidural Patient position: sitting Prep: DuraPrep Patient monitoring: blood pressure and continuous pulse ox Approach: midline Location: L3-L4 Injection technique: LOR air  Needle:  Needle type: Tuohy  Needle gauge: 17 G Needle length: 9 cm Catheter size: 19 Gauge Test dose: negative and Other (1% Lidocaine)  Additional Notes Patient identified.  Risk benefits discussed including failed block, incomplete pain control, headache, nerve damage, paralysis, blood pressure changes, nausea, vomiting, reactions to medication both toxic or allergic, and postpartum back pain.  Patient expressed understanding and wished to proceed.  All questions were answered.  Sterile technique used throughout procedure and epidural site dressed with sterile barrier dressing. No paresthesia or other complications noted. The patient did not experience any signs of intravascular injection such as tinnitus or metallic taste in mouth nor signs of intrathecal spread such as rapid motor block. Please see nursing notes for vital signs. Reason for block:procedure for pain

## 2020-09-20 NOTE — Progress Notes (Addendum)
Karen Larsen is a 30 y.o. G2P0010 at [redacted]w[redacted]d by ultrasound admitted for  elective IOL at term.   Subjective: S/P epidural, feeling shaky, dizzy, nauseated. Pain well controlled with epidural.  Anesthesia consult at [facility] a little while ago d/t bradycardic (maternal) episode and epidural decreased d/t elevated dermatome level.  Zofran given but ineffective, fluid bolus ongoing.   Discussed AROM recommendation by Dr. Ernestina Penna, R/B reviewed with patient and spouse, agrees to procedure.  Objective: Vitals:   09/20/20 1726 09/20/20 1731 09/20/20 1745 09/20/20 1801  BP: 119/74 122/73  127/85  Pulse: (!) 48 (!) 52  95  Resp:      Temp:    (!) 96.8 F (36 C)  TempSrc:    Axillary  SpO2:   99%   Weight:      Height:         FHT:  FHR: 120 bpm, variability: moderate,  accelerations:  Present,  decelerations:  Absent UC:   regular, every 2-3 minutes SVE:   Dilation: 5.5 Effacement (%): 90 Station: -1 Exam by:: D Alvia Tory CNM AROM, clear fluid  Labs:   Recent Labs    09/20/20 1235  WBC 10.5  HGB 13.6  HCT 40.4  PLT 187    Assessment / Plan: G2P0010 30 y.o. [redacted]w[redacted]d Spontaneous labor, progressing normally Post epidural reaction Phenergan ordered and pending Patient shaking and nausea improved after repositioning side-lying and applying warm blankets (axillary temp was 96.8)  Labor:  Continue Pitocin per protocol Position changes  frequently to facilitate fetal rotation Preeclampsia:  no signs or symptoms of toxicity Fetal Wellbeing:  Category I Pain Control:  Epidural I/D:   GBS negative Anticipated MOD:  NSVD  Fausto Skillern  Vic Ripper, CNM, MSN 09/20/2020, 6:19 PM

## 2020-09-20 NOTE — H&P (Signed)
Karen Larsen is a 30 y.o. G2P0010 at [redacted]w[redacted]d presenting for elective induction at 40 weeks. Pt notes intermittent contractions. Good fetal movement, No vaginal bleeding, not leaking fluid.  [facility] since first trimester -Rh-.  Early pregnancy bleeding with appropriate dosing of RhoGAM.  Resolution of subchronic hemorrhage -GERD on Pepcid -Hemorrhoids -Fetal growth at 36 weeks AGA, 83rd percentile   Prenatal Transfer Tool  Maternal Diabetes: No Genetic Screening: Normal Maternal Ultrasounds/Referrals: Normal Fetal Ultrasounds or other Referrals:  None Maternal Substance Abuse:  No Significant Maternal Medications:  None Significant Maternal Lab Results: Group B Strep negative     OB History     Gravida  2   Para      Term      Preterm      AB  1   Living         SAB  1   IAB      Ectopic      Multiple      Live Births             Past Medical History:  Diagnosis Date   Colon polyps    Past Surgical History:  Procedure Laterality Date   COLONOSCOPY  2018   FOOT SURGERY     Family History: family history includes Charcot-Marie-Tooth disease in her maternal grandfather and mother; Stroke in her father. Social History:  reports that she has never smoked. She has never used smokeless tobacco. She reports current alcohol use. She reports that she does not use drugs.  Review of Systems - Negative except discomfort of pregnancy   Dilation: 4 Effacement (%): 70 Station: -3 Exam by:: [employer] RN Blood pressure 116/82, pulse 60, resp. rate 16, height 5\' 5"  (1.651 m), weight 85.3 kg, unknown if currently breastfeeding.  Physical Exam:  Gen: well appearing, no distress  Abd: gravid, NT, no RUQ pain LE: No edema, equal bilaterally, non-tender  Prenatal labs: ABO, Rh: --/--/PENDING (07/20 1235) Antibody: PENDING (07/20 1235) Rubella: Immune RPR: Nonreactive (04/27 0000)  HBsAg: Negative HIV:   Negative GBS: Negative/--  (06/23 0000)  1 hr Glucola 99  Genetic screening normal panorama, normal AFP Anatomy 01-11-1972 normal   Assessment/Plan: 30 y.o. G2P0010 at [redacted]w[redacted]d Induction of labor.  Plan Pitocin 2 x 2 and AROM when in active labor GBS negative    [redacted]w[redacted]d 09/20/2020, 2:36 PM

## 2020-09-21 ENCOUNTER — Encounter (HOSPITAL_COMMUNITY): Payer: Self-pay | Admitting: Obstetrics

## 2020-09-21 DIAGNOSIS — Z6791 Unspecified blood type, Rh negative: Secondary | ICD-10-CM

## 2020-09-21 LAB — RPR: RPR Ser Ql: NONREACTIVE

## 2020-09-21 MED ORDER — ZOLPIDEM TARTRATE 5 MG PO TABS: 5.0000 mg | ORAL_TABLET | Freq: Every evening | ORAL | Status: DC | PRN

## 2020-09-21 MED ORDER — PRENATAL MULTIVITAMIN CH
1.0000 | ORAL_TABLET | Freq: Every day | ORAL | Status: DC
  Administered 2020-09-21 – 2020-09-22 (×2): 1 via ORAL
  Filled 2020-09-21 (×2): qty 1

## 2020-09-21 MED ORDER — IBUPROFEN 600 MG PO TABS
600.0000 mg | ORAL_TABLET | Freq: Four times a day (QID) | ORAL | Status: DC
  Administered 2020-09-21 – 2020-09-22 (×6): 600 mg via ORAL
  Filled 2020-09-21 (×6): qty 1

## 2020-09-21 MED ORDER — ONDANSETRON HCL 4 MG PO TABS: 4.0000 mg | ORAL_TABLET | ORAL | Status: DC | PRN

## 2020-09-21 MED ORDER — SIMETHICONE 80 MG PO CHEW: 80.0000 mg | CHEWABLE_TABLET | ORAL | Status: DC | PRN

## 2020-09-21 MED ORDER — TETANUS-DIPHTH-ACELL PERTUSSIS 5-2.5-18.5 LF-MCG/0.5 IM SUSY: 0.5000 mL | PREFILLED_SYRINGE | Freq: Once | INTRAMUSCULAR | Status: DC

## 2020-09-21 MED ORDER — DIBUCAINE (PERIANAL) 1 % EX OINT: 1.0000 "application " | TOPICAL_OINTMENT | CUTANEOUS | Status: DC | PRN

## 2020-09-21 MED ORDER — SENNOSIDES-DOCUSATE SODIUM 8.6-50 MG PO TABS
2.0000 | ORAL_TABLET | ORAL | Status: DC
  Administered 2020-09-21 – 2020-09-22 (×2): 2 via ORAL
  Filled 2020-09-21 (×2): qty 2

## 2020-09-21 MED ORDER — BENZOCAINE-MENTHOL 20-0.5 % EX AERO
1.0000 "application " | INHALATION_SPRAY | CUTANEOUS | Status: DC | PRN
  Administered 2020-09-21: 1 via TOPICAL
  Filled 2020-09-21: qty 56

## 2020-09-21 MED ORDER — ACETAMINOPHEN 325 MG PO TABS
650.0000 mg | ORAL_TABLET | ORAL | Status: DC | PRN
  Administered 2020-09-21: 650 mg via ORAL
  Filled 2020-09-21: qty 2

## 2020-09-21 MED ORDER — COCONUT OIL OIL
1.0000 "application " | TOPICAL_OIL | Status: DC | PRN
  Administered 2020-09-21: 1 via TOPICAL

## 2020-09-21 MED ORDER — DIPHENHYDRAMINE HCL 25 MG PO CAPS: 25.0000 mg | ORAL_CAPSULE | Freq: Four times a day (QID) | ORAL | Status: DC | PRN

## 2020-09-21 MED ORDER — OXYCODONE HCL 5 MG PO TABS: 5.0000 mg | ORAL_TABLET | ORAL | Status: DC | PRN

## 2020-09-21 MED ORDER — WITCH HAZEL-GLYCERIN EX PADS: 1.0000 "application " | MEDICATED_PAD | CUTANEOUS | Status: DC | PRN

## 2020-09-21 MED ORDER — ONDANSETRON HCL 4 MG/2ML IJ SOLN
4.0000 mg | INTRAMUSCULAR | Status: DC | PRN
  Administered 2020-09-21: 4 mg via INTRAVENOUS
  Filled 2020-09-21: qty 2

## 2020-09-21 MED ORDER — OXYCODONE HCL 5 MG PO TABS: 10.0000 mg | ORAL_TABLET | ORAL | Status: DC | PRN

## 2020-09-21 NOTE — Lactation Note (Signed)
This note was copied from a baby's chart. Lactation Consultation Note Mom sleeping soundly. FOB holding baby STS. FOB stated mom is exhausted. Told FOB LC would f/u on MBU. FOB stated OK.  Patient Name: Karen Larsen QQPYP'P Date: 09/21/2020   Age:29 hours  Maternal Data    Feeding    LATCH Score                    Lactation Tools Discussed/Used    Interventions    Discharge    Consult Status      Charyl Dancer 09/21/2020, 12:09 AM

## 2020-09-21 NOTE — Lactation Note (Addendum)
This note was copied from a baby's chart. Lactation Consultation Note  Patient Name: Karen Larsen Today's Date: 09/21/2020 Reason for consult: Initial assessment;Nipple pain/trauma Age:29 hours  LC called to room to assist with painful latch. Mother already has bruised nipples and abrasion on L nipple. Attempted latch but mother could not tolerate pain.  Attempted with #24NS with no reduction in pain.  Noted with oral assessment infant has short anterior lingual frenulum limiting tongue protrusion and causing painful latch.  Mother feels biting and pinching with latch. Suggest mother discuss with Ped MD. Resource sheet given and coconut oil.  Reviewed hand expression.  Encouraged frequent spoon feeding.   Set up DEBP.  Suggest mother pump q 3 hours and give volume back to baby. Observed pumping with 24 flanges and mother could tolerate.   Mother consented to donor milk and baby was supplemented with 15 ml of DM with slow flow nipple at mother's request.   Mom made aware of O/P services, breastfeeding support groups,  and our phone # for post-discharge questions.    Maternal Data Has patient been taught Hand Expression?: Yes Does the patient have breastfeeding experience prior to this delivery?: No  Feeding Mother's Current Feeding Choice: Breast Milk and Donor Milk  LATCH Score Latch: Grasps breast easily, tongue down, lips flanged, rhythmical sucking.  Audible Swallowing: A few with stimulation  Type of Nipple: Everted at rest and after stimulation  Comfort (Breast/Nipple): Engorged, cracked, bleeding, large blisters, severe discomfort  Hold (Positioning): Assistance needed to correctly position infant at breast and maintain latch.  LATCH Score: 6   Lactation Tools Discussed/Used Tools: Pump;Coconut oil;Nipple Shields Pump q 3 hours.  Cleaning and milk storage reviewed.   Interventions Interventions: Breast feeding basics reviewed;Assisted with latch;Skin to  skin;Hand express;Adjust position;Coconut oil;DEBP;Education  Discharge Pump: DEBP  Consult Status Consult Status: Follow-up Date: 09/22/20 Follow-up type: In-patient    Dahlia Byes [facility] 09/21/2020, 11:12 AM

## 2020-09-21 NOTE — Progress Notes (Signed)
    PPD # 1 S/P NSVD  Live born female  Birth Weight: 7 lb 13.2 oz (3549 g) APGAR: 8, 8  Newborn Delivery   Birth date/time: 09/[birth year]/2020 23:10:00 Delivery type: Vaginal, Spontaneous     Baby name: Hanz Delivering provider: Noland Fordyce  Episiotomy:None   Lacerations:1st degree   circumcision declined  Feeding: breast, nipple trauma from poor latch  Pain control at delivery: Epidural   S:  Reports feeling tired but well.             Tolerating po/ No nausea or vomiting             Bleeding is light             Pain controlled with acetaminophen and ibuprofen (OTC)             Up ad lib / ambulatory / voiding without difficulties   O:  A & O x 3, in no apparent distress              VS:  Vitals:   09/21/20 0046 09/21/20 0144 09/21/20 0251 09/21/20 0601  BP: (!) 102/55 122/72 119/73 109/68  Pulse: 67 77 66 (!) 57  Resp:  18 18 18   Temp:  97.9 F (36.6 C) 98.5 F (36.9 C) 98.3 F (36.8 C)  TempSrc:  Oral Oral Oral  SpO2:  100% 99% 99%  Weight:      Height:        LABS:  Recent Labs    09/20/20 1235  WBC 10.5  HGB 13.6  HCT 40.4  PLT 187    Blood type: --/--/B NEG (07/20 1235)  Rubella: Immune (06/23 0000)   I&O: I/O last 3 completed shifts: In: -  Out: 1350 [Urine:1000; Blood:350]          No intake/output data recorded.  Vaccines: TDaP          UTD                    COVID-19 UTD  Gen: AAO x 3, NAD  Abdomen: soft, non-tender, non-distended             Fundus: firm, non-tender, U-1  Perineum: repair intact, no edema  Lochia: small  Extremities: trace pedal edema, no calf pain or tenderness    A/P: PPD # 1 30 y.o., 01-11-1972   Principal Problem:   Postpartum care following vaginal delivery 7/21 Active Problems:   Encounter for induction of labor   Perineal laceration with delivery, first degree   SVD (spontaneous vaginal delivery)   Rh negative, maternal / newborn Rh negative   Doing well - stable status  Routine post partum  orders  Lactation support for latch  Anticipate discharge tomorrow    8/21, MSN, CNM 09/21/2020, 8:32 AM

## 2020-09-21 NOTE — Discharge Instructions (Signed)
Lactation outpatient support - home visit   Jessica Bowers, IBCLC (lactation consultant)  & Birth Doula  Phone (text or call): [phone] Email: [email or web address]   Linda Coppola RN, MHA, IBCLC at [facility]: Lactation Consultant  [email or web address] Mail: [email or web address] Tel: [phone]    Additional breastfeeding resources:  [facility] [email or web address]://ibconline.ca/information-[email or web address]  [employer] [place]  [email or web address]  Chiropractic specialist   Dr. Leanna Hastings [email or web address]   Craniosacral therapy for baby  Erin Balkind  [email or web address]  

## 2020-09-21 NOTE — Anesthesia Postprocedure Evaluation (Signed)
Anesthesia Post Note  Patient: Horald Pollen  Procedure(s) Performed: AN AD HOC LABOR EPIDURAL     Patient location during evaluation: Mother Baby Anesthesia Type: Epidural Level of consciousness: oriented and awake and alert Pain management: pain level controlled Vital Signs Assessment: post-procedure vital signs reviewed and stable Respiratory status: spontaneous breathing, respiratory function stable and nonlabored ventilation Cardiovascular status: blood pressure returned to baseline and stable Postop Assessment: no headache, no backache, no apparent nausea or vomiting and able to ambulate Anesthetic complications: no   No notable events documented.  Last Vitals:  Vitals:   09/21/20 0601 09/21/20 1146  BP: 109/68 108/68  Pulse: (!) 57 60  Resp: 18 18  Temp: 36.8 C 37.2 C  SpO2: 99%     Last Pain:  Vitals:   09/21/20 1410  TempSrc:   PainSc: 0-No pain   Pain Goal:                   Lucretia Kern

## 2020-09-22 MED ORDER — ACETAMINOPHEN 325 MG PO TABS: 650.0000 mg | ORAL_TABLET | ORAL | 1 refills | Status: DC | PRN

## 2020-09-22 MED ORDER — IBUPROFEN 600 MG PO TABS: 600.0000 mg | ORAL_TABLET | Freq: Four times a day (QID) | ORAL | 0 refills | Status: DC

## 2020-09-22 MED ORDER — BENZOCAINE-MENTHOL 20-0.5 % EX AERO
1.0000 | INHALATION_SPRAY | CUTANEOUS | 1 refills | Status: DC | PRN
Start: 2020-09-22 — End: 2023-04-30

## 2020-09-22 NOTE — Discharge Summary (Signed)
OB Discharge Summary  Patient Name: Karen Larsen DOB: Jul 03, 1990 MRN: [id number]  Date of admission: 09/20/2020 Delivering provider: Noland Fordyce   Admitting diagnosis: Encounter for induction of labor [Z34.90] Intrauterine pregnancy: [redacted]w[redacted]d     Secondary diagnosis: Patient Active Problem List   Diagnosis Date Noted   Perineal laceration with delivery, first degree 09/21/2020   SVD (spontaneous vaginal delivery) 09/21/2020   Postpartum care following vaginal delivery 7/21 09/21/2020   Rh negative, maternal / newborn Rh negative 09/21/2020   Encounter for induction of labor 09/20/2020    Date of discharge: 09/22/2020   Discharge diagnosis: Principal Problem:   Postpartum care following vaginal delivery 7/21 Active Problems:   Encounter for induction of labor   Perineal laceration with delivery, first degree   SVD (spontaneous vaginal delivery)   Rh negative, maternal / newborn Rh negative  Post partum procedures: None  Augmentation: AROM and Pitocin Pain control: Epidural  Laceration:1st degree  Episiotomy:None  Complications: None  Hospital course:  Induction of Labor With Vaginal Delivery   30 y.o. yo G2P1011 at [redacted]w[redacted]d was admitted to the hospital 09/20/2020 for induction of labor.  Indication for induction: Elective.  Patient had an uncomplicated labor course as follows: Membrane Rupture Time/Date: 6:04 PM ,09/20/2020   Delivery Method:Vaginal, Spontaneous  Episiotomy: None  Lacerations:  1st degree  Details of delivery can be found in separate delivery note.  Patient had a routine postpartum course. Patient is discharged home 09/22/20.  Newborn Data: Birth date:09/20/2020  Birth time:11:10 PM  Gender:Female  Living status:Living  Apgars:8 ,8  Weight:3549 g   Physical exam  Vitals:   09/21/20 1146 09/21/20 1547 09/21/20 2355 09/22/20 0530  BP: 108/68 111/67 108/64 107/60  Pulse: 60 (!) 59 64 70  Resp: 18 18 18 17   Temp: 98.9 F (37.2 C) 98.6 F (37 C) 98.3 F  (36.8 C) 98.1 F (36.7 C)  TempSrc: Oral Oral Oral Oral  SpO2:    100%  Weight:      Height:       General: alert, cooperative, and no distress Lochia: appropriate Uterine Fundus: firm Incision: N/A Perineum: repair intact, no edema DVT Evaluation: No evidence of DVT seen on physical exam. No significant calf/ankle edema. Labs: Lab Results  Component Value Date   WBC 10.5 09/20/2020   HGB 13.6 09/20/2020   HCT 40.4 09/20/2020   MCV 88.0 09/20/2020   PLT 187 09/20/2020   CMP Latest Ref Rng & Units 12/13/2018  Glucose 70 - 99 mg/dL 02/12/2019)  BUN 6 - 20 mg/dL 11  Creatinine 449(E - 0.10 mg/dL 0.71  Sodium 2.19 - 758 mmol/L 139  Potassium 3.5 - 5.1 mmol/L 3.1(L)  Chloride 98 - 111 mmol/L 107  CO2 22 - 32 mmol/L 22  Calcium 8.9 - 10.3 mg/dL 9.1  Total Protein 6.5 - 8.1 g/dL 6.6  Total Bilirubin 0.3 - 1.2 mg/dL 0.5  Alkaline Phos 38 - 126 U/L 53  AST 15 - 41 U/L 28  ALT 0 - 44 U/L 30   Edinburgh Postnatal Depression Scale Screening Tool 09/21/2020  I have been able to laugh and see the funny side of things. 0  I have looked forward with enjoyment to things. 0  I have blamed myself unnecessarily when things went wrong. 0  I have been anxious or worried for no good reason. 1  I have felt scared or panicky for no good reason. 0  Things have been getting on top of me. 1  I have been  so unhappy that I have had difficulty sleeping. 0  I have felt sad or miserable. 0  I have been so unhappy that I have been crying. 0  The thought of harming myself has occurred to me. 0  Edinburgh Postnatal Depression Scale Total 2   Vaccines: TDaP UTD         COVID-19   UTD  Discharge instructions:  per After Visit Summary  After Visit Meds:  Allergies as of 09/22/2020   No Known Allergies      Medication List     STOP taking these medications    aspirin EC 81 MG tablet   meclizine 25 MG tablet Commonly known as: ANTIVERT   ZzzQuil 50 MG/30ML Liqd Generic drug: diphenhydrAMINE  HCl       TAKE these medications    acetaminophen 325 MG tablet Commonly known as: Tylenol Take 2 tablets (650 mg total) by mouth every 4 (four) hours as needed (for pain scale < 4).   benzocaine-Menthol 20-0.5 % Aero Commonly known as: DERMOPLAST Apply 1 application topically as needed for irritation (perineal discomfort).   ibuprofen 600 MG tablet Commonly known as: ADVIL Take 1 tablet (600 mg total) by mouth every 6 (six) hours.   PRENATAL + COMPLETE MULTI PO Take 1 tablet by mouth daily.       Diet: routine diet  Activity: Advance as tolerated. Pelvic rest for 6 weeks.   Newborn Data: Live born female  Birth Weight: 7 lb 13.2 oz (3549 g) APGAR: 8, 8  Newborn Delivery   Birth date/time: 09/20/2020 23:10:00 Delivery type: Vaginal, Spontaneous     Named Hans Baby Feeding: Breast Disposition:home with mother  Delivery Report:  Review the Delivery Report for details.    Follow up:  Follow-up Information     Noland Fordyce, MD. Schedule an appointment as soon as possible for a visit in 6 week(s).   Specialty: Obstetrics and Gynecology Contact information: [address] [place] [address] [phone]                Clancy Gourd, MSN 09/22/2020, 9:25 AM

## 2020-10-02 ENCOUNTER — Telehealth (HOSPITAL_COMMUNITY): Payer: Self-pay

## 2020-10-02 NOTE — Telephone Encounter (Signed)
"  I'm doing relatively ok. This isn't a good time to talk. Is there any way you can call me at a different time?"   RN told patient we will be back in touch soon.   Marcelino Duster [facility] 10/02/2020,1808

## 2020-10-03 ENCOUNTER — Telehealth (HOSPITAL_COMMUNITY): Payer: Self-pay | Admitting: *Deleted

## 2020-10-03 NOTE — Telephone Encounter (Signed)
Re-attempted Hospital Discharge Follow-Up Call per patient's previous request.  Left voice mail message requesting that patient return RN's phone call.

## 2022-10-01 LAB — OB RESULTS CONSOLE HEPATITIS B SURFACE ANTIGEN: Hepatitis B Surface Ag: NEGATIVE

## 2022-10-01 LAB — OB RESULTS CONSOLE RPR: RPR: NONREACTIVE

## 2022-10-01 LAB — OB RESULTS CONSOLE HIV ANTIBODY (ROUTINE TESTING): HIV: NONREACTIVE

## 2022-10-01 LAB — OB RESULTS CONSOLE GC/CHLAMYDIA
Chlamydia: NEGATIVE
Neisseria Gonorrhea: NEGATIVE

## 2022-10-01 LAB — OB RESULTS CONSOLE RUBELLA ANTIBODY, IGM: Rubella: IMMUNE

## 2022-10-01 LAB — HEPATITIS C ANTIBODY: HCV Ab: NEGATIVE

## 2023-01-23 LAB — OB RESULTS CONSOLE ANTIBODY SCREEN: Antibody Screen: NEGATIVE

## 2023-03-05 NOTE — L&D Delivery Note (Signed)
 Delivery Note At 6:59 PM a viable and healthy female was delivered via Vaginal, Spontaneous (Presentation: Left Occiput Anterior).  APGAR: 8, 9; weight pending .   Placenta status: Spontaneous, Intact.  Cord: 3 vessels with the following complications: None.  Cord pH: n/a  Anesthesia: Epidural Episiotomy: None Lacerations:  2nd degree perineal  Suture Repair: 3.0 vicryl Est. Blood Loss (mL):  50 cc  Mom to postpartum.  Baby to Couplet care / Skin to Skin.  Karen Larsen A Juanda Luba 04/28/2023, 7:19 PM

## 2023-04-03 LAB — OB RESULTS CONSOLE GBS: GBS: POSITIVE

## 2023-04-17 ENCOUNTER — Inpatient Hospital Stay (HOSPITAL_COMMUNITY)
Admission: AD | Admit: 2023-04-17 | Discharge: 2023-04-17 | Disposition: A | Discharge status: Home / Self Care | Payer: BC Managed Care – PPO | Attending: Obstetrics and Gynecology | Admitting: Obstetrics and Gynecology

## 2023-04-17 ENCOUNTER — Encounter (HOSPITAL_COMMUNITY): Payer: Self-pay

## 2023-04-17 DIAGNOSIS — Z3A39 39 weeks gestation of pregnancy: Secondary | ICD-10-CM | POA: Diagnosis not present

## 2023-04-17 DIAGNOSIS — O479 False labor, unspecified: Secondary | ICD-10-CM

## 2023-04-17 DIAGNOSIS — O471 False labor at or after 37 completed weeks of gestation: Secondary | ICD-10-CM | POA: Diagnosis present

## 2023-04-17 NOTE — Discharge Instructions (Signed)
The [employer] This circuit takes at least 90 minutes to complete so clear your schedule and make mental preparations so you can relax in your environment.  The second step requires a lot of pillows so gather them up before beginning.  Before starting, you should empty your bladder! Have a nice drink nearby, and make sure it has a straw! If you are having contractions, this circuit should be done through contractions, try not to change positions between steps.  Step One: Open-kneeChest Stay in this position for 30 minutes, start in cat/cow, then drop your chest as low as you can to the bed or the floor and your bottom as high as you can. Knees should be fairly wide apart, and the angle between the torso/thighs should be wider than 90 degrees. Wiggle around, prop with lots of pillows and use this time to get totally relaxed. This position allows the baby to scoot out of the pelvis a bit and gives them room to rotate, shift their head position, etc. If the pregnant person finds it helpful, careful positioning with a rebozo under the belly, with gentle tension from a support person behind can help maintain this position for the full 30 minutes.  Step BJY:[id number] SideLying Roll to your left side, bringing your top leg as high as possible and keeping your bottom leg straight. Roll forward as much as possible, again using a lot of pillows. Sink into the bed and relax some more. If you fall asleep, that's totally okay and you can stay there! If not, stay here for at least another half an hour. Try and get your top right leg up towards your head and get as rolled over onto your belly as much as possible. If you repeat the circuit during labor, try alternating left and right sides. We know the photo the left is actually right side... just flip the image in your head.  Step Three: Moving and Lunges Lunge, walk stairs facing sideways, 2 at a time, (have a spotter  downstairs of you!), take a walk outside with one foot on the curb and the other on the street, sit on a birth ball and hula- anything that's upright and putting your pelvis in open, asymmetrical positions. Spend at least 30 minutes doing this one as well to give your baby a chance to move down. If you are lunging or stair or curb walking, you should lunge/walk/go up stairs in the direction that feels better to you. The key with the lunge is that the toes of the higher leg and mom's belly button should be at right angles. Do not lunge over your knee, that closes the pelvis.  You got this!!!!!!!!!!!!!!!!!!!!!!! :)

## 2023-04-17 NOTE — Progress Notes (Signed)
Ms. Karen Larsen is a G3P1011 at [redacted]w[redacted]d seen in MAU for labor. RN labor check, not seen by provider. SVE by RN Dilation: 3 Effacement (%): 60 Cervical Position: Posterior Station: -3 Exam by:: Arther Abbott RN   NST - FHR: 120 bpm / moderate variability / accels present / decels absent / TOCO: regular every 4-6 mins   Plan:  D/C home with labor precautions Celedonio Savage, MD  04/17/2023 11:33 AM

## 2023-04-17 NOTE — MAU Note (Signed)
..  Karen Larsen is a 33 y.o. at [redacted]w[redacted]d here in MAU reporting: ctx that started around 0800 this morning, states they are now every few minutes. States she has a lot of vaginal pressure. Denies VB or LOF. +FM.   Pain score: 7 Vitals:   04/17/23 1007  BP: 129/82  Pulse: 82  Resp: 16  SpO2: 100%      Lab orders placed from triage:   none noted

## 2023-04-22 ENCOUNTER — Encounter (HOSPITAL_COMMUNITY): Payer: Self-pay | Admitting: *Deleted

## 2023-04-22 ENCOUNTER — Telehealth (HOSPITAL_COMMUNITY): Payer: Self-pay | Admitting: *Deleted

## 2023-04-22 ENCOUNTER — Other Ambulatory Visit: Payer: Self-pay | Admitting: Obstetrics and Gynecology

## 2023-04-22 NOTE — Telephone Encounter (Signed)
 Preadmission screen

## 2023-04-28 ENCOUNTER — Inpatient Hospital Stay (HOSPITAL_COMMUNITY)
Admission: AD | Admit: 2023-04-28 | Discharge: 2023-04-30 | DRG: 807 | Disposition: A | Discharge status: Home / Self Care | Payer: BC Managed Care – PPO | Attending: Obstetrics and Gynecology | Admitting: Obstetrics and Gynecology

## 2023-04-28 ENCOUNTER — Inpatient Hospital Stay (HOSPITAL_COMMUNITY): Payer: BC Managed Care – PPO | Admitting: Anesthesiology

## 2023-04-28 ENCOUNTER — Other Ambulatory Visit: Payer: Self-pay

## 2023-04-28 ENCOUNTER — Encounter (HOSPITAL_COMMUNITY): Payer: Self-pay | Admitting: Obstetrics and Gynecology

## 2023-04-28 DIAGNOSIS — Z3A40 40 weeks gestation of pregnancy: Secondary | ICD-10-CM | POA: Diagnosis not present

## 2023-04-28 DIAGNOSIS — Z3490 Encounter for supervision of normal pregnancy, unspecified, unspecified trimester: Principal | ICD-10-CM | POA: Diagnosis present

## 2023-04-28 DIAGNOSIS — O99824 Streptococcus B carrier state complicating childbirth: Secondary | ICD-10-CM | POA: Diagnosis present

## 2023-04-28 DIAGNOSIS — O26893 Other specified pregnancy related conditions, third trimester: Principal | ICD-10-CM | POA: Diagnosis present

## 2023-04-28 DIAGNOSIS — Z6791 Unspecified blood type, Rh negative: Secondary | ICD-10-CM

## 2023-04-28 LAB — CBC
HCT: 37.7 % (ref 36.0–46.0)
Hemoglobin: 12.6 g/dL (ref 12.0–15.0)
MCH: 26.6 pg (ref 26.0–34.0)
MCHC: 33.4 g/dL (ref 30.0–36.0)
MCV: 79.7 fL — ABNORMAL LOW (ref 80.0–100.0)
Platelets: 166 10*3/uL (ref 150–400)
RBC: 4.73 MIL/uL (ref 3.87–5.11)
RDW: 12.9 % (ref 11.5–15.5)
WBC: 7.7 10*3/uL (ref 4.0–10.5)
nRBC: 0 % (ref 0.0–0.2)

## 2023-04-28 MED ORDER — WITCH HAZEL-GLYCERIN EX PADS
1.0000 | MEDICATED_PAD | CUTANEOUS | Status: DC | PRN
  Administered 2023-04-29: 1 via TOPICAL

## 2023-04-28 MED ORDER — DIBUCAINE (PERIANAL) 1 % EX OINT: 1.0000 | TOPICAL_OINTMENT | CUTANEOUS | Status: DC | PRN

## 2023-04-28 MED ORDER — FENTANYL-BUPIVACAINE-NACL 0.5-0.125-0.9 MG/250ML-% EP SOLN
12.0000 mL/h | EPIDURAL | Status: DC | PRN
  Administered 2023-04-28: 12 mL/h via EPIDURAL
  Filled 2023-04-28: qty 250

## 2023-04-28 MED ORDER — ONDANSETRON HCL 4 MG/2ML IJ SOLN: 4.0000 mg | INTRAMUSCULAR | Status: DC | PRN

## 2023-04-28 MED ORDER — SODIUM CHLORIDE 0.9 % IV SOLN
5.0000 10*6.[IU] | Freq: Once | INTRAVENOUS | Status: AC
  Administered 2023-04-28: 5 10*6.[IU] via INTRAVENOUS
  Filled 2023-04-28: qty 5

## 2023-04-28 MED ORDER — SOD CITRATE-CITRIC ACID 500-334 MG/5ML PO SOLN: 30.0000 mL | ORAL | Status: DC | PRN

## 2023-04-28 MED ORDER — EPHEDRINE 5 MG/ML INJ: 10.0000 mg | INTRAVENOUS | Status: DC | PRN

## 2023-04-28 MED ORDER — ZOLPIDEM TARTRATE 5 MG PO TABS: 5.0000 mg | ORAL_TABLET | Freq: Every evening | ORAL | Status: DC | PRN

## 2023-04-28 MED ORDER — LIDOCAINE HCL (PF) 1 % IJ SOLN: 30.0000 mL | INTRAMUSCULAR | Status: DC | PRN

## 2023-04-28 MED ORDER — LACTATED RINGERS IV SOLN: INTRAVENOUS | Status: DC

## 2023-04-28 MED ORDER — TETANUS-DIPHTH-ACELL PERTUSSIS 5-2.5-18.5 LF-MCG/0.5 IM SUSY
0.5000 mL | PREFILLED_SYRINGE | Freq: Once | INTRAMUSCULAR | Status: DC
Start: 2023-04-29 — End: 2023-04-30

## 2023-04-28 MED ORDER — PENICILLIN G POT IN DEXTROSE 60000 UNIT/ML IV SOLN: 3.0000 10*6.[IU] | INTRAVENOUS | Status: DC

## 2023-04-28 MED ORDER — LIDOCAINE-EPINEPHRINE (PF) 1.5 %-1:200000 IJ SOLN
INTRAMUSCULAR | Status: DC | PRN
  Administered 2023-04-28: 5 mL via EPIDURAL

## 2023-04-28 MED ORDER — COCONUT OIL OIL: 1.0000 | TOPICAL_OIL | Status: DC | PRN

## 2023-04-28 MED ORDER — LACTATED RINGERS IV SOLN: 500.0000 mL | INTRAVENOUS | Status: DC | PRN

## 2023-04-28 MED ORDER — ONDANSETRON HCL 4 MG PO TABS: 4.0000 mg | ORAL_TABLET | ORAL | Status: DC | PRN

## 2023-04-28 MED ORDER — OXYTOCIN BOLUS FROM INFUSION
333.0000 mL | Freq: Once | INTRAVENOUS | Status: AC
  Administered 2023-04-28: 333 mL via INTRAVENOUS

## 2023-04-28 MED ORDER — IBUPROFEN 600 MG PO TABS
600.0000 mg | ORAL_TABLET | Freq: Four times a day (QID) | ORAL | Status: DC
  Administered 2023-04-28 – 2023-04-30 (×6): 600 mg via ORAL
  Filled 2023-04-28 (×6): qty 1

## 2023-04-28 MED ORDER — SIMETHICONE 80 MG PO CHEW: 80.0000 mg | CHEWABLE_TABLET | ORAL | Status: DC | PRN

## 2023-04-28 MED ORDER — PRENATAL MULTIVITAMIN CH
1.0000 | ORAL_TABLET | Freq: Every day | ORAL | Status: DC
  Administered 2023-04-29: 1 via ORAL
  Filled 2023-04-28: qty 1

## 2023-04-28 MED ORDER — TERBUTALINE SULFATE 1 MG/ML IJ SOLN: 0.2500 mg | Freq: Once | INTRAMUSCULAR | Status: DC | PRN

## 2023-04-28 MED ORDER — PHENYLEPHRINE 80 MCG/ML (10ML) SYRINGE FOR IV PUSH (FOR BLOOD PRESSURE SUPPORT): 80.0000 ug | PREFILLED_SYRINGE | INTRAVENOUS | Status: DC | PRN

## 2023-04-28 MED ORDER — ONDANSETRON HCL 4 MG/2ML IJ SOLN
4.0000 mg | Freq: Four times a day (QID) | INTRAMUSCULAR | Status: DC | PRN
  Administered 2023-04-28 (×2): 4 mg via INTRAVENOUS
  Filled 2023-04-28 (×2): qty 2

## 2023-04-28 MED ORDER — OXYTOCIN-SODIUM CHLORIDE 30-0.9 UT/500ML-% IV SOLN
2.5000 [IU]/h | INTRAVENOUS | Status: DC
  Administered 2023-04-28: 2.5 [IU]/h via INTRAVENOUS
  Filled 2023-04-28: qty 500

## 2023-04-28 MED ORDER — OXYTOCIN-SODIUM CHLORIDE 30-0.9 UT/500ML-% IV SOLN: 1.0000 m[IU]/min | INTRAVENOUS | Status: DC

## 2023-04-28 MED ORDER — BENZOCAINE-MENTHOL 20-0.5 % EX AERO
1.0000 | INHALATION_SPRAY | CUTANEOUS | Status: DC | PRN
  Administered 2023-04-29: 1 via TOPICAL
  Filled 2023-04-28 (×2): qty 56

## 2023-04-28 MED ORDER — ACETAMINOPHEN 325 MG PO TABS
650.0000 mg | ORAL_TABLET | ORAL | Status: DC | PRN
  Administered 2023-04-29 (×3): 650 mg via ORAL
  Filled 2023-04-28 (×3): qty 2

## 2023-04-28 MED ORDER — ACETAMINOPHEN 325 MG PO TABS: 650.0000 mg | ORAL_TABLET | ORAL | Status: DC | PRN

## 2023-04-28 MED ORDER — DONOR BREAST MILK (FOR LABEL PRINTING ONLY): ORAL | Status: DC

## 2023-04-28 MED ORDER — SENNOSIDES-DOCUSATE SODIUM 8.6-50 MG PO TABS
2.0000 | ORAL_TABLET | Freq: Every day | ORAL | Status: DC
  Administered 2023-04-29: 2 via ORAL
  Filled 2023-04-28: qty 2

## 2023-04-28 MED ORDER — LACTATED RINGERS IV SOLN
500.0000 mL | Freq: Once | INTRAVENOUS | Status: AC
  Administered 2023-04-28: 500 mL via INTRAVENOUS

## 2023-04-28 MED ORDER — DIPHENHYDRAMINE HCL 25 MG PO CAPS: 25.0000 mg | ORAL_CAPSULE | Freq: Four times a day (QID) | ORAL | Status: DC | PRN

## 2023-04-28 MED ORDER — DIPHENHYDRAMINE HCL 50 MG/ML IJ SOLN: 12.5000 mg | INTRAMUSCULAR | Status: DC | PRN

## 2023-04-28 MED ORDER — PHENYLEPHRINE 80 MCG/ML (10ML) SYRINGE FOR IV PUSH (FOR BLOOD PRESSURE SUPPORT)
80.0000 ug | PREFILLED_SYRINGE | INTRAVENOUS | Status: DC | PRN
  Administered 2023-04-28 (×2): 80 ug via INTRAVENOUS
  Filled 2023-04-28: qty 10

## 2023-04-28 NOTE — Anesthesia Preprocedure Evaluation (Addendum)
 Anesthesia Evaluation  Patient identified by MRN, date of birth, ID band Patient awake    Reviewed: Allergy & Precautions, NPO status , Patient's Chart, lab work & pertinent test results  Airway Mallampati: II  TM Distance: >3 FB Neck ROM: Full    Dental no notable dental hx.    Pulmonary neg pulmonary ROS   Pulmonary exam normal        Cardiovascular negative cardio ROS  Rhythm:Regular Rate:Normal     Neuro/Psych negative neurological ROS  negative psych ROS   GI/Hepatic negative GI ROS, Neg liver ROS,,,  Endo/Other  negative endocrine ROS    Renal/GU negative Renal ROS  negative genitourinary   Musculoskeletal negative musculoskeletal ROS (+)    Abdominal Normal abdominal exam  (+)   Peds  Hematology Lab Results      Component                Value               Date                      WBC                      7.7                 04/28/2023                HGB                      12.6                04/28/2023                HCT                      37.7                04/28/2023                MCV                      79.7 (L)            04/28/2023                PLT                      166                 04/28/2023              Anesthesia Other Findings   Reproductive/Obstetrics (+) Pregnancy                             Anesthesia Physical Anesthesia Plan  ASA: 2  Anesthesia Plan: Epidural   Post-op Pain Management:    Induction:   PONV Risk Score and Plan: 2 and Treatment may vary due to age or medical condition  Airway Management Planned: Natural Airway  Additional Equipment: None  Intra-op Plan:   Post-operative Plan:   Informed Consent: I have reviewed the patients History and Physical, chart, labs and discussed the procedure including the risks, benefits and alternatives for the proposed anesthesia with the patient or authorized representative who has indicated  his/her understanding and acceptance.  Dental advisory given  Plan Discussed with:   Anesthesia Plan Comments:        Anesthesia Quick Evaluation

## 2023-04-28 NOTE — MAU Note (Signed)
...  Karen Larsen is a 33 y.o. at [redacted]w[redacted]d here in MAU reporting: CTX's for the past hour that are currently every 3-5 minutes. Feels as if she is constantly urinating. Denies VB. +FM.   GBS+.  Onset of complaint: One hour ago Pain score: 10/10 lower abdomen  FHT: 150 initial external Lab orders placed from triage: MAU Labor Eval

## 2023-04-28 NOTE — H&P (Signed)
 Karen Larsen is a 33 y.o. female [phone] [redacted]w[redacted]d presenting for labor  Patient reports onset of more significant and painful contractions around 2PM today. No LOF or VB. Good FM throughout.   Prenatal care at [facility]. Pregnancy dated by LMP c/w 8w sono. Routine prenatal labs done WNL. She had a low risk NIPT, negative AFP1 screen, and normal fetal anatomy scan. She had a normal 1hr GTT 104 and Hgb 12 at third trimester screening. She received Tdap and Rhogam on 12/12. She received RSV vaccine on 1/22. Most recent growth US performed at 35.6 showed vertex presentation with an EFW 6#7 (66%) and posterior placenta. Positive GBS screen. 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 Patient was counseled on and declined invasive genetic testing in this pregnancy.   Patient and husband 'Karen Larsen' are expecting another baby boy.  OB History     Gravida  3   Para  1   Term  1   Preterm      AB  1   Living  1      SAB  1   IAB      Ectopic      Multiple  0   Live Births  1          Past Medical History:  Diagnosis Date   Colon polyps    Past Surgical History:  Procedure Laterality Date   COLONOSCOPY  2018   FOOT SURGERY     Family History: family history includes Charcot-Marie-Tooth disease in her maternal grandfather and mother; Stroke in her father. Social History:  reports that she has never smoked. She has never used smokeless tobacco. She reports current alcohol use. She reports that she does not use drugs.     Maternal Diabetes: No Genetic Screening: Normal Maternal Ultrasounds/Referrals: Normal Fetal Ultrasounds or other Referrals:  None Maternal Substance Abuse:  No Significant Maternal Medications:  None Significant Maternal Lab Results:  Group B Strep positive Other Comments:  None  Review of Systems  All other systems reviewed and are negative.  Per HPI Maternal Exam:  Uterine  Assessment: Contraction strength is firm.  Contraction frequency is regular.  Abdomen: Estimated fetal weight is 8#0.   Fetal presentation: vertex Introitus: Normal vulva. Normal vagina.  Pelvis: adequate for delivery.     Fetal Exam Fetal Monitor Review: Baseline rate: 120.  Variability: moderate (6-25 bpm).   Pattern: accelerations present and no decelerations.   Fetal State Assessment: Category I - tracings are normal.   Physical Exam Vitals reviewed.  Constitutional:      Appearance: She is normal weight.  HENT:     Head: Normocephalic.  Cardiovascular:     Rate and Rhythm: Normal rate.  Pulmonary:     Effort: Pulmonary effort is normal.  Abdominal:     Tenderness: There is no abdominal tenderness.  Genitourinary:    General: Normal vulva.  Musculoskeletal:        General: Normal range of motion.     Cervical back: Normal range of motion.  Skin:    General: Skin is warm and dry.  Neurological:     General: No focal deficit present.     Mental Status: She is alert and oriented to person, place, and time.  Psychiatric:        Mood and Affect: Mood normal.        Behavior: Behavior normal.     Dilation: 5.5 Effacement (%):  80 Station: -3 Exam by:: Erle Crocker, RN Blood pressure 122/68, pulse 66, unknown if currently breastfeeding.  Prenatal labs: ABO, Rh:  --/--/PENDING (02/24 1526) Antibody: PENDING (02/24 1526) Rubella: Immune (07/30 0000) RPR: Nonreactive (07/30 0000)  HBsAg: Negative (07/30 0000)  HIV: Non-reactive (07/30 0000)  GBS: Positive/-- (01/30 0000)   ChemistryNo results for input(s): "NA", "K", "CL", "CO2", "GLUCOSE", "BUN", "CREATININE", "CALCIUM", "GFRNONAA", "GFRAA", "ANIONGAP" in the last 168 hours.  No results for input(s): "PROT", "ALBUMIN", "AST", "ALT", "ALKPHOS", "BILITOT" in the last 168 hours. Hematology Recent Labs  Lab 04/28/23 1526  WBC 7.7  RBC 4.73  HGB 12.6  HCT 37.7  MCV 79.7*  MCH 26.6  MCHC 33.4  RDW 12.9   PLT 166   Cardiac EnzymesNo results for input(s): "TROPONINI" in the last 168 hours. No results for input(s): "TROPIPOC" in the last 168 hours.  BNPNo results for input(s): "BNP", "PROBNP" in the last 168 hours.  DDimer No results for input(s): "DDIMER" in the last 168 hours.   Assessment/Plan: [name] is a 33 y.o. female G64P1011 [redacted]w[redacted]d admitted with active labor.   -Admission to LD -Routine admit orders WNL -Active labor: expectant mgmt -PCN for +GBS -Epidural labor pain mgmt -Continuous EFM/Toco: Cat I -Routine intrapartum care -Anticipate SVD  Daveyon Kitchings A Joan Avetisyan 04/28/2023, 3:53 PM

## 2023-04-28 NOTE — Lactation Note (Signed)
 This note was copied from a baby's chart. Lactation Consultation Note  Patient Name: Karen Larsen [id number] Date: 04/28/2023 Age:32 hours Reason for consult: L&D Initial assessment;Mother's request;Term;Nipple pain/trauma;Breastfeeding assistance;RN request  P2- RN requested latching assistance in L&D. MOB reports that her first child could not latch due to a severe tongue tie and even after revision 1 week pp, infant still would not latch. When LC entered room, infant was crying and had his mouth wide open. LC was able to perform an oral exam at this time. LC noted that infant seems to have decrease lingual movement forward. LC also noted that infant lingual frenulum started at the tip of infant's lower gum line, all the way to the tip of infant's tongue. MOB reported that she noticed this as well and she plans on calling her pediatric dentist tomorrow morning to make an appointment.  LC placed infant on the right breast and infant latched immediately. Infant has a beautiful latch and nurses well with some stimulation to continue. Infant nursed for 15 minutes before MOB requested for him to be released due to severe pain. When LC unlatched infant, MOB's nipple was round in shape, but completely blanched white. After a few minutes color returned to the nipple. MOB requested to have LC set up the DEBP in her room upstairs because she does not know if she will be able to continue latching infant if it is this painful. LC went to the 5th floor after the consult and set up the DEBP with size 21 mm flanges. LC reviewed feeding infant on cue 8-12x in 24 hrs, not allowing infant to go over 3 hrs without a feeding, CDC milk storage guidelines and LC services handout. LC encouraged MOB to call for further assistance as needed.  Maternal Data Has patient been taught Hand Expression?: Yes Does the patient have breastfeeding experience prior to this delivery?: Yes How long did the patient breastfeed?: 1  year  Feeding Mother's Current Feeding Choice: Breast Milk  LATCH Score Latch: Repeated attempts needed to sustain latch, nipple held in mouth throughout feeding, stimulation needed to elicit sucking reflex.  Audible Swallowing: A few with stimulation  Type of Nipple: Everted at rest and after stimulation  Comfort (Breast/Nipple): Engorged, cracked, bleeding, large blisters, severe discomfort  Hold (Positioning): Full assist, staff holds infant at breast  LATCH Score: 4   Lactation Tools Discussed/Used Tools: Pump;Flanges Flange Size: 21 Breast pump type: Double-Electric Breast Pump;Manual Pump Education: Setup, frequency, and cleaning;Milk Storage Reason for Pumping: MOB request Pumping frequency: 15-20 min every 3 hrs  Interventions Interventions: Breast feeding basics reviewed;Assisted with latch;Hand express;Breast compression;Adjust position;Support pillows;Position options;Hand pump;DEBP;Education;LC Services brochure  Discharge Discharge Education: Engorgement and breast care;Warning signs for feeding baby Pump: Hands Free;Personal  Consult Status Consult Status: Follow-up from L&D Date: 04/29/23 Follow-up type: In-patient    Dema Severin BS, IBCLC 04/28/2023, 8:41 PM

## 2023-04-28 NOTE — Anesthesia Procedure Notes (Signed)
 Epidural Patient location during procedure: OB Start time: 04/28/2023 4:00 PM End time: 04/28/2023 4:05 PM  Staffing Anesthesiologist: Atilano Median, DO Performed: anesthesiologist   Preanesthetic Checklist Completed: patient identified, IV checked, site marked, risks and benefits discussed, surgical consent, monitors and equipment checked, pre-op evaluation and timeout performed  Epidural Patient position: sitting Prep: ChloraPrep Patient monitoring: heart rate, continuous pulse ox and blood pressure Approach: midline Location: L4-L5 Injection technique: LOR saline  Needle:  Needle type: Tuohy  Needle gauge: 17 G Needle length: 9 cm Needle insertion depth: 7 cm Catheter type: closed end flexible Catheter size: 20 Guage Catheter at skin depth: 13 cm Test dose: negative and 1.5% lidocaine with Epi 1:200 K  Assessment Events: blood not aspirated, no cerebrospinal fluid, injection not painful, no injection resistance and no paresthesia  Additional Notes Patient identified. Risks/Benefits/Options discussed with patient including but not limited to bleeding, infection, nerve damage, paralysis, failed block, incomplete pain control, headache, blood pressure changes, nausea, vomiting, reactions to medications, itching and postpartum back pain. Confirmed with bedside nurse the patient's most recent platelet count. Confirmed with patient that they are not currently taking any anticoagulation, have any bleeding history or any family history of bleeding disorders. Patient expressed understanding and wished to proceed. All questions were answered. Sterile technique was used throughout the entire procedure. Please see nursing notes for vital signs. Test dose was given through epidural catheter and negative prior to continuing to dose epidural or start infusion. Warning signs of high block given to the patient including shortness of breath, tingling/numbness in hands, complete motor block,  or any concerning symptoms with instructions to call for help. Patient was given instructions on fall risk and not to get out of bed. All questions and concerns addressed with instructions to call with any issues or inadequate analgesia.    Reason for block:procedure for pain

## 2023-04-28 NOTE — Lactation Note (Signed)
 This note was copied from a baby's chart. Lactation Consultation Note  Patient Name: Karen Larsen ZOXWR'U Date: 04/28/2023 Age:32 hours Reason for consult: Follow-up assessment;Term;Nipple pain/trauma  P2- LC checked on MOB in her new room and also wanted to review how to use the DEBP. MOB reported that although her husband wants infant to focus on latching to the breast, MOB may end up focusing on pumping until infant can be seen by their dentist. MOB reports that infant latched again and caused severe pain and nipple damage. MOB showed LC her breasts and LC noted bruising on the left nipple/areola and redness on the right nipple. LC reassured MOB that it's okay to focus on pumping at this time if it is too painful to latch infant.   LC provided MOB with coconut oil and encouraged her to use it frequently. LC also reviewed the DEBP. MOB is currently using the 21 mm flanges, but may need to size down to an 188 mm flange tomorrow when her swelling goes down. LC encouraged MOB to call for further assistance as needed.  Maternal Data Has patient been taught Hand Expression?: Yes Does the patient have breastfeeding experience prior to this delivery?: Yes How long did the patient breastfeed?: 1 year  Feeding Mother's Current Feeding Choice: Breast Milk and Formula  LATCH Score Latch: Repeated attempts needed to sustain latch, nipple held in mouth throughout feeding, stimulation needed to elicit sucking reflex.  Audible Swallowing: A few with stimulation  Type of Nipple: Everted at rest and after stimulation  Comfort (Breast/Nipple): Engorged, cracked, bleeding, large blisters, severe discomfort  Hold (Positioning): Full assist, staff holds infant at breast  LATCH Score: 4   Lactation Tools Discussed/Used Tools: Pump;Flanges;Coconut oil Flange Size: 21 (may need to go down to 18 mm flanges tomrrow when swelling goes down a little) Breast pump type: Double-Electric Breast  Pump;Manual Pump Education: Setup, frequency, and cleaning;Milk Storage Reason for Pumping: MOB request Pumping frequency: 15-20 min every 3 hrs  Interventions Interventions: Breast feeding basics reviewed;Coconut oil;Hand pump;DEBP;Education;Pace feeding;LC Services brochure  Discharge Discharge Education: Engorgement and breast care;Warning signs for feeding baby Pump: Personal;Hands Free  Consult Status Consult Status: Follow-up Date: 04/29/23 Follow-up type: In-patient    Dema Severin BS, IBCLC 04/28/2023, 11:02 PM

## 2023-04-29 LAB — TYPE AND SCREEN
ABO/RH(D): B NEG
Antibody Screen: POSITIVE

## 2023-04-29 LAB — RPR: RPR Ser Ql: NONREACTIVE

## 2023-04-29 LAB — CBC
HCT: 30.5 % — ABNORMAL LOW (ref 36.0–46.0)
Hemoglobin: 10.3 g/dL — ABNORMAL LOW (ref 12.0–15.0)
MCH: 26.8 pg (ref 26.0–34.0)
MCHC: 33.8 g/dL (ref 30.0–36.0)
MCV: 79.4 fL — ABNORMAL LOW (ref 80.0–100.0)
Platelets: 131 10*3/uL — ABNORMAL LOW (ref 150–400)
RBC: 3.84 MIL/uL — ABNORMAL LOW (ref 3.87–5.11)
RDW: 12.9 % (ref 11.5–15.5)
WBC: 8.5 10*3/uL (ref 4.0–10.5)
nRBC: 0 % (ref 0.0–0.2)

## 2023-04-29 MED ORDER — RHO D IMMUNE GLOBULIN 1500 UNIT/2ML IJ SOSY
300.0000 ug | PREFILLED_SYRINGE | Freq: Once | INTRAMUSCULAR | Status: AC
  Administered 2023-04-29: 300 ug via INTRAVENOUS
  Filled 2023-04-29: qty 2

## 2023-04-29 NOTE — Anesthesia Postprocedure Evaluation (Signed)
 Anesthesia Post Note  Patient: Karen Larsen  Procedure(s) Performed: AN AD HOC LABOR EPIDURAL     Patient location during evaluation: Mother Baby Anesthesia Type: Epidural Level of consciousness: awake and alert and oriented Pain management: satisfactory to patient Vital Signs Assessment: post-procedure vital signs reviewed and stable Respiratory status: respiratory function stable Cardiovascular status: stable Postop Assessment: no headache, no backache, epidural receding, patient able to bend at knees, no signs of nausea or vomiting, adequate PO intake and able to ambulate Anesthetic complications: no   No notable events documented.  Last Vitals:  Vitals:   04/29/23 0251 04/29/23 0557  BP: 113/74 113/80  Pulse: 69 67  Resp: 16 17  Temp: 37 C 36.9 C  SpO2: 98% 98%    Last Pain:  Vitals:   04/29/23 0603  TempSrc:   PainSc: 4    Pain Goal:                   [name]

## 2023-04-29 NOTE — Lactation Note (Signed)
 This note was copied from a baby's chart. Lactation Consultation Note  Patient Name: Karen Larsen ZOXWR'U Date: 04/29/2023 Age:32 hours Reason for consult: Follow-up assessment;Term;Nipple pain/trauma  P2- MOB reports that she has been focusing on exclusive pumping until infant's tie release tomorrow. MOB reported that she attempted to latch infant once today to make sure he will still latch and he created more damage to her nipples. MOB has been applying coconut oil every hour to help with healing. LC encouraged MOB to latch infant after the tie release to assist with pain management and to promote healing. LC retaught FOB how to pace feed infant in the side lying position. LC also switched infant from the white Nfant nipple to the yellow hospital throw away nipple. Infant needed a faster flow at this time. LC encouraged MOB/FOB to increase the volume of breast milk he is drinking, now that he is on day 2 of life. MOB denied having further questions or concerns. LC encouraged MOB to call for further assistance as needed.  Maternal Data Has patient been taught Hand Expression?: Yes Does the patient have breastfeeding experience prior to this delivery?: Yes How long did the patient breastfeed?: 1 year  Feeding Mother's Current Feeding Choice: Breast Milk Nipple Type: Slow - flow  Lactation Tools Discussed/Used Tools: Pump;Flanges;Coconut oil Flange Size: 21 Breast pump type: Double-Electric Breast Pump;Manual Pump Education: Setup, frequency, and cleaning;Milk Storage Reason for Pumping: MOB request Pumping frequency: 15-20 min every 3 hrs  Interventions Interventions: Breast feeding basics reviewed;Coconut oil;Hand pump;DEBP;Education;Pace feeding;LC Services brochure  Discharge Discharge Education: Warning signs for feeding baby;Engorgement and breast care Pump: Hands Free;Personal  Consult Status Consult Status: Follow-up Date: 04/30/23 Follow-up type:  In-patient    Dema Severin BS, IBCLC 04/29/2023, 8:30 PM

## 2023-04-29 NOTE — Progress Notes (Signed)
 Post Partum Day One Subjective: Doing well this morning. Reports moderate VB with small clots, changing pad every 4 hours appropriate. Ambulating without dizziness. Spontaneously voiding without difficulties. Tolerating regular diet without N/V. No HA, CP, or SOB.  Baby boy doing well at bedside. Declines neonatal circumcision.  Objective: Patient Vitals for the past 24 hrs:  BP Temp Temp src Pulse Resp SpO2 Height Weight  04/29/23 1128 117/72 97.7 F (36.5 C) Oral 78 18 98 % -- --  04/29/23 0557 113/80 98.4 F (36.9 C) Oral 67 17 98 % -- --  04/29/23 0251 113/74 98.6 F (37 C) Oral 69 16 98 % -- --  04/28/23 2240 116/66 97.8 F (36.6 C) Oral 60 17 100 % -- --  04/28/23 2145 120/65 (!) 97.4 F (36.3 C) Oral (!) 58 16 98 % -- --  04/28/23 2100 124/73 -- -- 60 -- -- -- --  04/28/23 2045 116/69 -- -- (!) 58 -- -- -- --  04/28/23 2030 125/71 -- -- 63 -- -- -- --  04/28/23 2015 116/69 -- -- 69 -- -- -- --  04/28/23 2000 115/84 -- -- (!) 124 -- -- -- --  04/28/23 1945 128/78 -- -- 68 -- -- -- --  04/28/23 1930 107/80 -- -- (!) 201 -- -- -- --  04/28/23 1915 129/71 -- -- (!) 117 -- -- -- --  04/28/23 1900 125/71 -- -- 79 -- -- -- --  04/28/23 1830 (!) 103/56 -- -- (!) 55 -- -- -- --  04/28/23 1800 (!) 106/53 -- -- (!) 58 -- -- -- --  04/28/23 1730 116/74 -- -- (!) 56 -- 99 % -- --  04/28/23 1650 123/73 -- -- (!) 51 -- 97 % -- --  04/28/23 1645 (!) 102/57 -- -- 73 -- 98 % -- --  04/28/23 1635 118/74 -- -- (!) 59 -- 97 % -- --  04/28/23 1630 118/72 -- -- (!) 57 -- 97 % -- --  04/28/23 1627 -- -- -- -- -- -- 5\' 9"  (1.753 m) 82.6 kg  04/28/23 1625 117/75 -- -- 67 -- 98 % -- --  04/28/23 1620 113/72 98 F (36.7 C) Oral 75 -- 98 % -- --  04/28/23 1615 117/66 -- -- 65 -- 97 % -- --  04/28/23 1610 125/73 -- -- 67 -- 98 % -- --  04/28/23 1605 134/82 -- -- 76 -- 100 % -- --  04/28/23 1506 122/68 -- -- 66 -- -- -- --    Physical Exam:  General: alert and no distress Lochia:  appropriate Uterine Fundus: firm DVT Evaluation: No evidence of DVT seen on physical exam.  Recent Labs    04/28/23 1526 04/29/23 0434  WBC 7.7 8.5  HGB 12.6 10.3*  HCT 37.7 30.5*  PLT 166 131*    No results for input(s): "NA", "K", "CL", "CO2CT", "BUN", "CREATININE", "GLUCOSE", "BILITOT", "ALT", "AST", "ALKPHOS", "PROT", "ALBUMIN" in the last 72 hours.  No results for input(s): "CALCIUM", "MG", "PHOS" in the last 72 hours.  No results for input(s): "PROTIME", "APTT", "INR" in the last 72 hours.  No results for input(s): "PROTIME", "APTT", "INR", "FIBRINOGEN" in the last 72 hours. Assessment/Plan: Plan for discharge tomorrow  Coy Vandoren 33 y.o. [id number] PPD#1 sp NSVD  1. PPC: cont routine PP PO pain regimen, encourage ambulation, regular diet 2. RH NEG: Baby RH POS: f/u PP Rhogam 3. Acute blood loss anemia; PPD1 Hgb 10.3 not clinically significant, asymptomatic and VB appropriate 4. LC support PRN  Could consider DC home later tonight if baby is discharged by Peds. Otherwise, will plan for DC home tomorrow on PPD2   LOS: 1 day   Angle Dirusso A Samaiya Awadallah 04/29/2023, 12:41 PM

## 2023-04-30 LAB — RH IG WORKUP (INCLUDES ABO/RH)
Fetal Screen: NEGATIVE
Gestational Age(Wks): 40
Unit division: 0

## 2023-04-30 MED ORDER — ACETAMINOPHEN 500 MG PO TABS
1000.0000 mg | ORAL_TABLET | Freq: Four times a day (QID) | ORAL | Status: AC
Start: 2023-04-30 — End: 2023-05-07

## 2023-04-30 MED ORDER — IBUPROFEN 200 MG PO TABS
600.0000 mg | ORAL_TABLET | Freq: Four times a day (QID) | ORAL | Status: AC
Start: 2023-04-30 — End: 2023-05-07

## 2023-04-30 MED ORDER — POLYETHYLENE GLYCOL 3350 17 G PO PACK: 17.0000 g | PACK | Freq: Every day | ORAL | Status: DC

## 2023-04-30 NOTE — Progress Notes (Signed)
 CSW received a consult for anx/dep worsening during pregnancy and attempted to meet with MOB at beside for support. CSW entered the room and noticed that MOB and the had already been discharged.  Enos Fling, Theresia Majors Clinical Social Worker [phone]

## 2023-04-30 NOTE — Lactation Note (Signed)
 This note was copied from a baby's chart. Lactation Consultation Note  Patient Name: Karen Larsen [id number] Date: 04/30/2023 Age:32 hours Reason for consult: Follow-up assessment;Term;Infant weight loss;Difficult latch Tongue Mobility challenges causing a DL. Per mom will be going to a 2 pm appt with Dr. Lexine Baton in [facility] for full assessment of the tongue mobility. Per mom baby is doing well with the bottle and last feeding was 30 ml of breast milk.  LC reviewed supply and demand importance of consistent pumping if the baby isn't latching around the clock . Prevention and tx of engorgement, Storage of breastmilk.  LC recommended LC O/P F/U - see below for details.   Maternal Data Has patient been taught Hand Expression?: Yes  Feeding Mother's Current Feeding Choice: Breast Milk Nipple Type: Slow - flow   Lactation Tools Discussed/Used Tools: Pump;Flanges Flange Size: 21 Breast pump type: Double-Electric Breast Pump;Manual Pump Education: Setup, frequency, and cleaning;Milk Storage  Interventions Interventions: Breast feeding basics reviewed;Coconut oil;Hand pump;DEBP;Education;LC Services brochure;CDC Guidelines for Breast Pump Cleaning  Discharge Discharge Education: Engorgement and breast care;Warning signs for feeding baby;Outpatient recommendation (LC informed mom [place] LC O/P or her Pedis office in 5-7 days post tongue tie assessment by oral specialist) Pump: Hands Free;Personal;Manual  Consult Status Consult Status: Complete Date: 04/30/23    Kathrin Greathouse 04/30/2023, 9:50 AM

## 2023-04-30 NOTE — Discharge Instructions (Signed)
 Congratulations! We hope you have a wonderful postpartum period. We will plan to see you in the office in 6 weeks. Please call the office if you experience fevers (>100.4 F), heavy vaginal bleeding (using >2 pads/hour), severe pain not responsive to ibuprofen (Advil or Motrin) and acetaminophen (Tylenol), chest pain, shortness of breath, or if you have pain, redness, or swelling in one or both legs. Mood swings, fatigue, and feeling down can be normal in the first two weeks following birth. If you feel your mood symptoms are severe or lasting longer than two weeks, please call our office. If you have any thoughts of hurting yourself or others please call our office. Please call your pediatrician if you have any concerns about your baby.

## 2023-04-30 NOTE — Discharge Summary (Signed)
 Postpartum Discharge Summary   Patient Name: Karen Larsen DOB: May 16, 1990 MRN: [id number]  Date of admission: 04/28/2023 Delivery date:04/28/2023 Delivering provider: Clance Boll A Date of discharge: 04/30/2023  Admitting diagnosis: Encounter for induction of labor [Z34.90] Intrauterine pregnancy: [redacted]w[redacted]d     Secondary diagnosis:  Active Problems:   Perineal laceration with delivery, first degree   SVD (spontaneous vaginal delivery)   Rh negative, maternal / newborn Rh negative  Additional problems: None    Discharge diagnosis: Term Pregnancy Delivered                                              Post partum procedures:rhogam Augmentation: AROM Complications: None  Hospital course: Onset of Labor With Vaginal Delivery      33 y.o. yo [id number] at [redacted]w[redacted]d was admitted in Latent Labor on 04/28/2023. Labor course was complicated bynone  Membrane Rupture Time/Date: 6:51 PM,04/28/2023  Delivery Method:Vaginal, Spontaneous Operative Delivery:N/A Episiotomy: None Lacerations:    Patient had a postpartum course complicated by none.  She is ambulating, tolerating a regular diet, passing flatus, and urinating well. Patient is discharged home in stable condition on 04/30/23.  Newborn Data: Birth date:04/28/2023 Birth time:6:59 PM Gender:Female Living status:Living Apgars:8 ,9  Weight:3900 g  Magnesium Sulfate received: No BMZ received: No Rhophylac:Yes MMR:N/A T-DaP:Given prenatally Flu: Yes RSV Vaccine received: Yes Transfusion:No Immunizations administered: There is no immunization history for the selected administration types on file for this patient.  Physical exam  Vitals:   04/29/23 0557 04/29/23 1128 04/29/23 2330 04/30/23 0630  BP: 113/80 117/72 99/66 104/60  Pulse: 67 78 69 66  Resp: 17 18 17 16   Temp: 98.4 F (36.9 C) 97.7 F (36.5 C) 98.1 F (36.7 C) 98 F (36.7 C)  TempSrc: Oral Oral Oral Oral  SpO2: 98% 98% 100% 98%  Weight:      Height:       General:  alert, cooperative, and no distress Lochia: appropriate Uterine Fundus: firm Incision: N/A DVT Evaluation: No evidence of DVT seen on physical exam. Labs: Lab Results  Component Value Date   WBC 8.5 04/29/2023   HGB 10.3 (L) 04/29/2023   HCT 30.5 (L) 04/29/2023   MCV 79.4 (L) 04/29/2023   PLT 131 (L) 04/29/2023      Latest Ref Rng & Units 12/13/2018    4:46 PM  CMP  Glucose 70 - 99 mg/dL 469   BUN 6 - 20 mg/dL 11   Creatinine 6.29 - 1.00 mg/dL 5.28   Sodium 413 - 244 mmol/L 139   Potassium 3.5 - 5.1 mmol/L 3.1   Chloride 98 - 111 mmol/L 107   CO2 22 - 32 mmol/L 22   Calcium 8.9 - 10.3 mg/dL 9.1   Total Protein 6.5 - 8.1 g/dL 6.6   Total Bilirubin 0.3 - 1.2 mg/dL 0.5   Alkaline Phos 38 - 126 U/L 53   AST 15 - 41 U/L 28   ALT 0 - 44 U/L 30    Edinburgh Score:    04/28/2023    9:45 PM  Edinburgh Postnatal Depression Scale Screening Tool  I have been able to laugh and see the funny side of things. 1  I have looked forward with enjoyment to things. 1  I have blamed myself unnecessarily when things went wrong. 1  I have been anxious or worried for no good reason. 2  I have felt scared or panicky for no good reason. 0  Things have been getting on top of me. 1  I have been so unhappy that I have had difficulty sleeping. 0  I have felt sad or miserable. 1  I have been so unhappy that I have been crying. 0  The thought of harming myself has occurred to me. 0  Edinburgh Postnatal Depression Scale Total 7      After visit meds:  Allergies as of 04/30/2023   No Known Allergies      Medication List     STOP taking these medications    benzocaine-Menthol 20-0.5 % Aero Commonly known as: DERMOPLAST       TAKE these medications    acetaminophen 500 MG tablet Commonly known as: TYLENOL Take 2 tablets (1,000 mg total) by mouth every 6 (six) hours for 7 days. What changed:  medication strength how much to take when to take this reasons to take this   calcium  carbonate 500 MG chewable tablet Commonly known as: TUMS - dosed in mg elemental calcium Chew 1 tablet by mouth daily.   ibuprofen 200 MG tablet Commonly known as: Advil Take 3 tablets (600 mg total) by mouth every 6 (six) hours for 7 days. What changed:  medication strength when to take this   polyethylene glycol 17 g packet Commonly known as: MiraLax Take 17 g by mouth daily.   PRENATAL + COMPLETE MULTI PO Take 1 tablet by mouth daily.         Discharge home in stable condition Infant Feeding: Breast Infant Disposition:home with mother Discharge instruction: per After Visit Summary and Postpartum booklet. Activity: Advance as tolerated. Pelvic rest for 6 weeks.  Diet: routine diet Anticipated Birth Control: Unsure Postpartum Appointment:6 weeks Additional Postpartum F/U: None Future Appointments:No future appointments. Follow up Visit:  Follow-up Information     Obgyn, [facility]. Schedule an appointment as soon as possible for a visit in 6 week(s).   Contact information: [address] [place] [address] [phone]                     04/30/2023 Edger House, MD

## 2023-04-30 NOTE — Progress Notes (Signed)
 Postpartum Progress Note  PPD#2 s/p SVD  S: Patient seen and examined at bedside. Reports feeling overall well. Pain well controlled, ambulating, tolerating regular diet, voiding without issue. Passing flatus, has not yet had a bowel movement. Denies fever, chills, chest pain, shortness of breath.  Feeding: plans to breastfeed Circ: S/p circumcision  O:  Vitals:   04/29/23 2330 04/30/23 0630  BP: 99/66 104/60  Pulse: 69 66  Resp: 17 16  Temp: 98.1 F (36.7 C) 98 F (36.7 C)  SpO2: 100% 98%    PE:  GA: well appearing, NAD CV: RRR, normal S1, S2 Lungs: CTAB Abd: soft, appropriately tender, fundus firm below umbilicus Peri: moderate lochia Ext: no TTP, +1 non-pitting edema  Labs:  Lab Results  Component Value Date   WBC 8.5 04/29/2023   HGB 10.3 (L) 04/29/2023   HCT 30.5 (L) 04/29/2023   MCV 79.4 (L) 04/29/2023   PLT 131 (L) 04/29/2023   Lab Results  Component Value Date   CREATININE 0.73 12/13/2018    A/P:  33 y.o.yo [id number] PPD#2 s/p SVD (EBL 50 mL) withRh negative, doing well and progressing appropriately. Vitals within normal limits. Physical exam benign. Labs normal. Plan as follows:   #Routine OB - Regular diet, HLIV - ERAS for pain control  - Rh negative, Rhogam given - DVT ppx: ambulating - BCM: Unsure  #Neonate - S/p circumcision -plans to breastfeed   Plan discharge home today.   Marlene Bast, MD

## 2023-05-01 ENCOUNTER — Inpatient Hospital Stay (HOSPITAL_COMMUNITY)
Admission: RE | Admit: 2023-05-01 | Payer: BC Managed Care – PPO | Source: Home / Self Care | Admitting: Obstetrics and Gynecology

## 2023-05-01 ENCOUNTER — Inpatient Hospital Stay (HOSPITAL_COMMUNITY): Payer: BC Managed Care – PPO

## 2023-05-06 ENCOUNTER — Telehealth (HOSPITAL_COMMUNITY): Payer: Self-pay | Admitting: *Deleted

## 2023-05-06 NOTE — Telephone Encounter (Signed)
 05/06/2023  Name: Karen Larsen MRN: [id number] DOB: 26-Mar-1990  Reason for Call:  Transition of Care Hospital Discharge Call  Contact Status: Patient Contact Status: Complete  Language assistant needed: Interpreter Mode: Interpreter Not Needed        Follow-Up Questions: Do You Have Any Concerns About Your Health As You Heal From Delivery?: No Do You Have Any Concerns About Your Infants Health?: No  Edinburgh Postnatal Depression Scale:  In the Past 7 Days: I have been able to laugh and see the funny side of things.: Not at all I have looked forward with enjoyment to things.: Definitely less than I used to I have blamed myself unnecessarily when things went wrong.: Not very often I have been anxious or worried for no good reason.: Yes, very often I have felt scared or panicky for no good reason.: No, not at all Things have been getting on top of me.: Yes, sometimes I haven't been coping as well as usual I have been so unhappy that I have had difficulty sleeping.: Yes, sometimes I have felt sad or miserable.: Yes, quite often I have been so unhappy that I have been crying.: Only occasionally The thought of harming myself has occurred to me.: Never Edinburgh Postnatal Depression Scale Total: (!) 16  PHQ2-9 Depression Scale:     Discharge Follow-up: Edinburgh score requires follow up?: No Patient was advised of the following resources:: Support Group, Breastfeeding Support Group  Post-discharge interventions: Reviewed Newborn Safe Sleep Practices Maternal Mental Health Resources provided  Salena Saner, RN 05/06/2023 16:23

## 2023-08-07 NOTE — Therapy (Deleted)
 OUTPATIENT PHYSICAL THERAPY FEMALE PELVIC EVALUATION   Patient Name: Karen Larsen MRN: [id number] DOB:Apr 06, 1990, 33 y.o., female Today's Date: 08/07/2023  END OF SESSION:   Past Medical History:  Diagnosis Date   Colon polyps    Past Surgical History:  Procedure Laterality Date   COLONOSCOPY  2018   FOOT SURGERY     Patient Active Problem List   Diagnosis Date Noted   Perineal laceration with delivery, first degree 09/21/2020   SVD (spontaneous vaginal delivery) 09/21/2020   Rh negative, maternal / newborn Rh negative 09/21/2020    PCP: none  REFERRING PROVIDER: Law, Cassandra A, DO   REFERRING DIAG: M62.9 (ICD-10-CM) - Disorder of muscle, unspecified   THERAPY DIAG:  No diagnosis found.  Rationale for Evaluation and Treatment: Rehabilitation  ONSET DATE: ***  SUBJECTIVE:                                                                                                                                                                                           SUBJECTIVE STATEMENT: Vaginal birth on 04/28/23. Second degree tear.  Fluid intake:   PAIN:  Are you having pain? {yes/no:20286} NPRS scale: ***/10 Pain location: {pelvic pain location:27098}  Pain type: {type:313116} Pain description: {PAIN DESCRIPTION:21022940}   Aggravating factors: *** Relieving factors: ***  PRECAUTIONS: None  RED FLAGS: {PT Red Flags:29287}   WEIGHT BEARING RESTRICTIONS: No  FALLS:  Has patient fallen in last 6 months? {fallsyesno:27318}  OCCUPATION: ***  ACTIVITY LEVEL : ***  PLOF: {PLOF:24004}  PATIENT GOALS: ***  PERTINENT HISTORY:  See above Sexual abuse: {Yes/No:304960894}  BOWEL MOVEMENT: Pain with bowel movement: {yes/no:20286} Type of bowel movement:{PT BM type:27100} Fully empty rectum: {No/Yes:304960894} Leakage: {Yes/No:304960894} Pads: {Yes/No:304960894} Fiber supplement/laxative {YES/NO AS:20300}  URINATION: Pain with urination:  {yes/no:20286} Fully empty bladder: {Yes/No:[id number]}*** Stream: {PT urination:27102} Urgency: {YES/NO AS:20300} Frequency: *** Leakage: {PT leakage:27103} Pads: {Yes/No:304960894}  INTERCOURSE:  Ability to have vaginal penetration {YES/NO:21197} Pain with intercourse: {pain with intercourse PA:27099} Dryness{YES/NO AS:20300} Climax: *** Marinoff Scale: ***/3 Laxative:  PREGNANCY: Vaginal deliveries *** Tearing {Yes***/No:304960894} Episiotomy {YES/NO AS:20300} C-section deliveries *** Currently pregnant {Yes***/No:[id number]}  PROLAPSE: {PT prolapse:27101}   OBJECTIVE:  Note: Objective measures were completed at Evaluation unless otherwise noted.  DIAGNOSTIC FINDINGS:  ***  PATIENT SURVEYS:  {rehab surveys:24030}  PFIQ-7: ***  COGNITION: Overall cognitive status: {cognition:24006}     SENSATION: Light touch: {intact/deficits:24005}  LUMBAR SPECIAL TESTS:  {lumbar special test:25242}  FUNCTIONAL TESTS:  {Functional tests:24029}  GAIT: Assistive device utilized: {Assistive devices:23999} Comments: ***  POSTURE: {posture:25561}   LUMBARAROM/PROM:  A/PROM A/PROM  eval  Flexion   Extension   Right lateral flexion  Left lateral flexion   Right rotation   Left rotation    (Blank rows = not tested)  LOWER EXTREMITY ROM:  {AROM/PROM:27142} ROM Right eval Left eval  Hip flexion    Hip extension    Hip abduction    Hip adduction    Hip internal rotation    Hip external rotation    Knee flexion    Knee extension    Ankle dorsiflexion    Ankle plantarflexion    Ankle inversion    Ankle eversion     (Blank rows = not tested)  LOWER EXTREMITY MMT:  MMT Right eval Left eval  Hip flexion    Hip extension    Hip abduction    Hip adduction    Hip internal rotation    Hip external rotation    Knee flexion    Knee extension    Ankle dorsiflexion    Ankle plantarflexion    Ankle inversion    Ankle eversion     (Blank rows = not  tested) PALPATION:   General: ***  Pelvic Alignment: ***  Abdominal: ***                External Perineal Exam: ***                             Internal Pelvic Floor: ***  Patient confirms identification and approves PT to assess internal pelvic floor and treatment {yes/no:20286}  PELVIC MMT:   MMT eval  Vaginal   Internal Anal Sphincter   External Anal Sphincter   Puborectalis   Diastasis Recti   (Blank rows = not tested)        TONE: ***  PROLAPSE: ***  TODAY'S TREATMENT:                                                                                                                              DATE: ***  EVAL ***   PATIENT EDUCATION:  Education details: *** Person educated: {Person educated:25204} Education method: {Education Method:25205} Education comprehension: {Education Comprehension:25206}  HOME EXERCISE PROGRAM: ***  ASSESSMENT:  CLINICAL IMPRESSION: Patient is a *** y.o. *** who was seen today for physical therapy evaluation and treatment for ***.   OBJECTIVE IMPAIRMENTS: {opptimpairments:25111}.   ACTIVITY LIMITATIONS: {activitylimitations:27494}  PARTICIPATION LIMITATIONS: {participationrestrictions:25113}  PERSONAL FACTORS: {Personal factors:25162} are also affecting patient's functional outcome.   REHAB POTENTIAL: {rehabpotential:25112}  CLINICAL DECISION MAKING: {clinical decision making:25114}  EVALUATION COMPLEXITY: {Evaluation complexity:25115}   GOALS: Goals reviewed with patient? {yes/no:20286}  SHORT TERM GOALS: Target date: ***  *** Baseline: Goal status: INITIAL  2.  *** Baseline:  Goal status: INITIAL  3.  *** Baseline:  Goal status: INITIAL  4.  *** Baseline:  Goal status: INITIAL  5.  *** Baseline:  Goal status: INITIAL  6.  *** Baseline:  Goal status: INITIAL  LONG TERM GOALS: Target date: ***  ***  Baseline:  Goal status: INITIAL  2.  *** Baseline:  Goal status: INITIAL  3.   *** Baseline:  Goal status: INITIAL  4.  *** Baseline:  Goal status: INITIAL  5.  *** Baseline:  Goal status: INITIAL  6.  *** Baseline:  Goal status: INITIAL  PLAN:  PT FREQUENCY: {rehab frequency:25116}  PT DURATION: {rehab duration:25117}  PLANNED INTERVENTIONS: {rehab planned interventions:25118::"97110-Therapeutic exercises","97530- Therapeutic [phone]- Neuromuscular re-education","97535- Self JXBJ","47829- Manual therapy"}  PLAN FOR NEXT SESSION: ***   Amare Bail, PT 08/07/2023, 8:05 AM

## 2023-08-08 ENCOUNTER — Ambulatory Visit: Admitting: Physical Therapy

## 2023-12-14 ENCOUNTER — Encounter (HOSPITAL_COMMUNITY): Payer: Self-pay

## 2023-12-14 ENCOUNTER — Ambulatory Visit (HOSPITAL_COMMUNITY)
Admission: RE | Admit: 2023-12-14 | Discharge: 2023-12-14 | Disposition: A | Discharge status: Home / Self Care | Payer: Self-pay | Source: Ambulatory Visit

## 2023-12-14 VITALS — BP 97/61 | HR 114 | Temp 99.5°F | Resp 16

## 2023-12-14 DIAGNOSIS — N61 Mastitis without abscess: Secondary | ICD-10-CM

## 2023-12-14 MED ORDER — IBUPROFEN 800 MG PO TABS: 800.0000 mg | ORAL_TABLET | Freq: Three times a day (TID) | ORAL | 0 refills | Status: AC

## 2023-12-14 MED ORDER — DICLOXACILLIN SODIUM 500 MG PO CAPS: 500.0000 mg | ORAL_CAPSULE | Freq: Four times a day (QID) | ORAL | 0 refills | Status: AC

## 2023-12-14 NOTE — Discharge Instructions (Signed)
 Physical exam was consistent with mastitis.  Take the antibiotics 4 times daily with food for the next 10 days.  You can alternate Tylenol  and ibuprofen  every 4-6 hours.  You can ice the breast.  Continue to breast-feed and pump as tolerated.  Symptoms should improve over the next 1 to 2 days.  If you do not develop improvement or if you have new or worsening symptoms please follow-up with your OB/GYN, go to [facility]U or return to clinic for reevaluation.

## 2023-12-14 NOTE — ED Provider Notes (Addendum)
 [facility]    CSN: [id number] Arrival date & time: 12/14/23  1628      History   Chief Complaint Chief Complaint  Patient presents with   Breast Problem    Suspect mastitis - Entered by patient    HPI Karen Larsen is a 33 y.o. female.   Patient presents to clinic over concern of left breast pain that started abruptly around 4 AM this morning.  Thought she had a clogged duct so she attempted to pump, had a lot of pain and difficulty pumping.  Went to bed and tried to pump again around 8 AM, unsuccessful.  Pain kept increasing.  Patient with  chills, sweats, fatigue and generalized bodyaches.  Patient is breast-feeding and pumping.  Has had Tylenol  earlier today.  The history is provided by the patient and medical records.    Past Medical History:  Diagnosis Date   Colon polyps     Patient Active Problem List   Diagnosis Date Noted   Perineal laceration with delivery, first degree 09/21/2020   SVD (spontaneous vaginal delivery) 09/21/2020   Rh negative, maternal / newborn Rh negative 09/21/2020    Past Surgical History:  Procedure Laterality Date   COLONOSCOPY  2018   FOOT SURGERY      OB History     Gravida  3   Para  2   Term  2   Preterm      AB  1   Living  2      SAB  1   IAB      Ectopic      Multiple  0   Live Births  2            Home Medications    Prior to Admission medications   Medication Sig Start Date End Date Taking? Authorizing Provider  dicloxacillin (DYNAPEN) 500 MG capsule Take 1 capsule (500 mg total) by mouth 4 (four) times daily for 10 days. 12/14/23 12/24/23 Yes Talik Casique  N, FNP  escitalopram (LEXAPRO) 10 MG tablet Take 10 mg by mouth daily. 11/09/23  Yes [provider]  ibuprofen  (ADVIL ) 800 MG tablet Take 1 tablet (800 mg total) by mouth 3 (three) times daily. 12/14/23  Yes Judas Mohammad  N, FNP  levonorgestrel (MIRENA) 20 MCG/DAY IUD 1 each by Intrauterine route once.    Yes [provider]  calcium carbonate (TUMS - DOSED IN MG ELEMENTAL CALCIUM) 500 MG chewable tablet Chew 1 tablet by mouth daily.    [provider]  Prenat-Methylfol-Chol-Fish Oil (PRENATAL + COMPLETE MULTI PO) Take 1 tablet by mouth daily.    [provider]    Family History Family History  Problem Relation Age of Onset   Charcot-Marie-Tooth disease Mother    Stroke Father    Charcot-Marie-Tooth disease Maternal Grandfather     Social History Social History   Tobacco Use   Smoking status: Never   Smokeless tobacco: Never  Vaping Use   Vaping status: Never Used  Substance Use Topics   Alcohol use: Yes    Comment: 1 drink nightly   Drug use: Never     Allergies   Patient has no known allergies.   Review of Systems Review of Systems  Per HPI  Physical Exam Triage Vital Signs ED Triage Vitals  Encounter Vitals Group     BP 12/14/23 1640 97/61     Girls Systolic BP Percentile --      Girls Diastolic BP Percentile --  Boys Systolic BP Percentile --      Boys Diastolic BP Percentile --      Pulse Rate 12/14/23 1640 (!) 114     Resp 12/14/23 1640 16     Temp 12/14/23 1640 99.5 F (37.5 C)     Temp Source 12/14/23 1640 Oral     SpO2 12/14/23 1640 95 %     Weight --      Height --      Head Circumference --      Peak Flow --      Pain Score 12/14/23 1638 8     Pain Loc --      Pain Education --      Exclude from Growth Chart --    No data found.  Updated Vital Signs BP 97/61 (BP Location: Left Arm)   Pulse (!) 114   Temp 99.5 F (37.5 C) (Oral)   Resp 16   LMP  (LMP Unknown)   SpO2 95%   Breastfeeding Yes   Visual Acuity Right Eye Distance:   Left Eye Distance:   Bilateral Distance:    Right Eye Near:   Left Eye Near:    Bilateral Near:     Physical Exam Vitals and nursing note reviewed. Exam conducted with a chaperone present.  Constitutional:      Appearance: Normal appearance.  HENT:     Head:  Normocephalic and atraumatic.     Right Ear: External ear normal.     Left Ear: External ear normal.     Nose: Nose normal.     Mouth/Throat:     Mouth: Mucous membranes are moist.  Eyes:     Conjunctiva/sclera: Conjunctivae normal.  Cardiovascular:     Rate and Rhythm: Normal rate.  Pulmonary:     Effort: Pulmonary effort is normal. No respiratory distress.  Chest:  Breasts:    Left: Tenderness present.    Musculoskeletal:        General: Normal range of motion.  Skin:    General: Skin is warm and dry.  Neurological:     General: No focal deficit present.     Mental Status: She is alert and oriented to person, place, and time.  Psychiatric:        Mood and Affect: Mood normal.        Behavior: Behavior normal. Behavior is cooperative.      UC Treatments / Results  Labs (all labs ordered are listed, but only abnormal results are displayed) Labs Reviewed - No data to display  EKG   Radiology No results found.  Procedures Procedures (including critical care time)  Medications Ordered in UC Medications - No data to display  Initial Impression / Assessment and Plan / UC Course  I have reviewed the triage vital signs and the nursing notes.  Pertinent labs & imaging results that were available during my care of the patient were reviewed by me and considered in my medical decision making (see chart for details).  Vitals and triage reviewed, patient is hemodynamically stable.  Does have low-grade temp and tachycardia.  Appears to have mastitis of the left breast, chaperone present for breast exam reveals erythema, tenderness and warmth of the left breast.  Will place on dicloxacillin 500 mg 4 times daily for the next 10 days.  Encouraged reassessment in 1 to 2 days if no improvement, discussed complications such as sepsis and systemic illness if infection progresses.  Plan of care, follow-up care return precautions given,  no questions at this time.     Final  Clinical Impressions(s) / UC Diagnoses   Final diagnoses:  Mastitis in female     Discharge Instructions      Physical exam was consistent with mastitis.  Take the antibiotics 4 times daily with food for the next 10 days.  You can alternate Tylenol  and ibuprofen  every 4-6 hours.  You can ice the breast.  Continue to breast-feed and pump as tolerated.  Symptoms should improve over the next 1 to 2 days.  If you do not develop improvement or if you have new or worsening symptoms please follow-up with your OB/GYN, go to MAU or return to clinic for reevaluation.     ED Prescriptions     Medication Sig Dispense Auth. Provider   dicloxacillin (DYNAPEN) 500 MG capsule Take 1 capsule (500 mg total) by mouth 4 (four) times daily for 10 days. 40 capsule Dreama, Ladesha Pacini  N, FNP   ibuprofen  (ADVIL ) 800 MG tablet Take 1 tablet (800 mg total) by mouth 3 (three) times daily. 21 tablet Dreama Deedee SAILOR, FNP      PDMP not reviewed this encounter.   Dreama Delona SAILOR, FNP 12/14/23 1714    Dreama, Lucita Montoya  N, FNP 12/14/23 1714    Dreama, Alysabeth Scalia  N, FNP 12/14/23 1745

## 2023-12-14 NOTE — ED Triage Notes (Signed)
 Patient here today with c/o left side breast pain that started at 4 am this morning. Patient states that she is also having some chills, sweats, fatigue, and body aches. Patient is nursing and pumping.

## 2024-02-09 ENCOUNTER — Ambulatory Visit (HOSPITAL_COMMUNITY): Payer: Self-pay

## 2024-02-10 ENCOUNTER — Other Ambulatory Visit: Payer: Self-pay | Admitting: Certified Nurse Midwife

## 2024-02-10 DIAGNOSIS — N644 Mastodynia: Secondary | ICD-10-CM

## 2024-02-13 ENCOUNTER — Ambulatory Visit: Admission: RE | Admit: 2024-02-13 | Source: Ambulatory Visit

## 2024-02-13 ENCOUNTER — Ambulatory Visit
Admission: RE | Admit: 2024-02-13 | Discharge: 2024-02-13 | Disposition: A | Source: Ambulatory Visit | Attending: Certified Nurse Midwife

## 2024-02-13 ENCOUNTER — Ambulatory Visit
Admission: RE | Admit: 2024-02-13 | Discharge: 2024-02-13 | Disposition: A | Source: Ambulatory Visit | Attending: Certified Nurse Midwife | Admitting: Certified Nurse Midwife

## 2024-02-13 DIAGNOSIS — N644 Mastodynia: Secondary | ICD-10-CM

## 2024-02-14 ENCOUNTER — Other Ambulatory Visit: Payer: Self-pay

## 2024-03-08 ENCOUNTER — Other Ambulatory Visit: Payer: Self-pay
